# Patient Record
Sex: Female | Born: 1940 | Race: Black or African American | Hispanic: No | Marital: Single
Health system: Southern US, Community
[De-identification: ages and names within clinical notes are randomized; demographics above are authoritative.]

---

## 1998-08-01 ENCOUNTER — Ambulatory Visit (HOSPITAL_COMMUNITY): Admission: RE | Admit: 1998-08-01 | Discharge: 1998-08-01 | Payer: Self-pay | Admitting: Family Medicine

## 1999-05-31 ENCOUNTER — Encounter: Payer: Self-pay | Admitting: Family Medicine

## 1999-05-31 ENCOUNTER — Ambulatory Visit (HOSPITAL_COMMUNITY): Admission: RE | Admit: 1999-05-31 | Discharge: 1999-05-31 | Payer: Self-pay | Admitting: Family Medicine

## 1999-12-17 ENCOUNTER — Other Ambulatory Visit: Admission: RE | Admit: 1999-12-17 | Discharge: 1999-12-17 | Payer: Self-pay | Admitting: Family Medicine

## 2001-12-08 ENCOUNTER — Other Ambulatory Visit: Admission: RE | Admit: 2001-12-08 | Discharge: 2001-12-08 | Payer: Self-pay | Admitting: Family Medicine

## 2001-12-14 ENCOUNTER — Ambulatory Visit (HOSPITAL_COMMUNITY): Admission: RE | Admit: 2001-12-14 | Discharge: 2001-12-14 | Payer: Self-pay | Admitting: Family Medicine

## 2001-12-14 ENCOUNTER — Encounter: Payer: Self-pay | Admitting: Family Medicine

## 2005-07-31 ENCOUNTER — Ambulatory Visit (HOSPITAL_COMMUNITY): Admission: RE | Admit: 2005-07-31 | Discharge: 2005-07-31 | Payer: Self-pay | Admitting: Family Medicine

## 2007-09-06 ENCOUNTER — Encounter: Admission: RE | Admit: 2007-09-06 | Discharge: 2007-09-06 | Payer: Self-pay | Admitting: Family Medicine

## 2008-07-26 ENCOUNTER — Ambulatory Visit (HOSPITAL_COMMUNITY): Admission: RE | Admit: 2008-07-26 | Discharge: 2008-07-26 | Payer: Self-pay | Admitting: Family Medicine

## 2010-07-22 ENCOUNTER — Other Ambulatory Visit (HOSPITAL_COMMUNITY): Payer: Self-pay | Admitting: Family Medicine

## 2010-07-22 DIAGNOSIS — Z1231 Encounter for screening mammogram for malignant neoplasm of breast: Secondary | ICD-10-CM

## 2010-07-25 ENCOUNTER — Ambulatory Visit (HOSPITAL_COMMUNITY)
Admission: RE | Admit: 2010-07-25 | Discharge: 2010-07-25 | Disposition: A | Discharge status: Home / Self Care | Payer: BC Managed Care – PPO | Source: Ambulatory Visit | Attending: Family Medicine | Admitting: Family Medicine

## 2010-07-25 DIAGNOSIS — Z1231 Encounter for screening mammogram for malignant neoplasm of breast: Secondary | ICD-10-CM | POA: Insufficient documentation

## 2011-03-31 ENCOUNTER — Ambulatory Visit
Admission: RE | Admit: 2011-03-31 | Discharge: 2011-03-31 | Disposition: A | Discharge status: Home / Self Care | Payer: BC Managed Care – PPO | Source: Ambulatory Visit | Attending: Family Medicine | Admitting: Family Medicine

## 2011-03-31 ENCOUNTER — Other Ambulatory Visit: Payer: Self-pay | Admitting: Family Medicine

## 2011-03-31 DIAGNOSIS — T148XXA Other injury of unspecified body region, initial encounter: Secondary | ICD-10-CM

## 2011-04-01 ENCOUNTER — Other Ambulatory Visit: Payer: Self-pay | Admitting: Family Medicine

## 2011-04-01 DIAGNOSIS — R52 Pain, unspecified: Secondary | ICD-10-CM

## 2012-01-12 ENCOUNTER — Ambulatory Visit
Admission: RE | Admit: 2012-01-12 | Discharge: 2012-01-12 | Disposition: A | Discharge status: Home / Self Care | Payer: BC Managed Care – PPO | Source: Ambulatory Visit | Attending: Family Medicine | Admitting: Family Medicine

## 2012-01-12 ENCOUNTER — Other Ambulatory Visit: Payer: Self-pay | Admitting: Family Medicine

## 2012-01-12 DIAGNOSIS — M791 Myalgia, unspecified site: Secondary | ICD-10-CM

## 2012-01-12 DIAGNOSIS — R609 Edema, unspecified: Secondary | ICD-10-CM

## 2012-10-14 ENCOUNTER — Other Ambulatory Visit (HOSPITAL_COMMUNITY): Payer: Self-pay | Admitting: Family Medicine

## 2012-10-14 DIAGNOSIS — Z1231 Encounter for screening mammogram for malignant neoplasm of breast: Secondary | ICD-10-CM

## 2012-10-20 ENCOUNTER — Ambulatory Visit (HOSPITAL_COMMUNITY)
Admission: RE | Admit: 2012-10-20 | Discharge: 2012-10-20 | Disposition: A | Discharge status: Home / Self Care | Payer: BC Managed Care – PPO | Source: Ambulatory Visit | Attending: Family Medicine | Admitting: Family Medicine

## 2012-10-20 DIAGNOSIS — Z1231 Encounter for screening mammogram for malignant neoplasm of breast: Secondary | ICD-10-CM

## 2013-10-19 ENCOUNTER — Other Ambulatory Visit (HOSPITAL_COMMUNITY): Payer: Self-pay | Admitting: Family Medicine

## 2013-10-19 DIAGNOSIS — Z1231 Encounter for screening mammogram for malignant neoplasm of breast: Secondary | ICD-10-CM

## 2013-11-02 ENCOUNTER — Ambulatory Visit (HOSPITAL_COMMUNITY)
Admission: RE | Admit: 2013-11-02 | Discharge: 2013-11-02 | Disposition: A | Discharge status: Home / Self Care | Payer: BC Managed Care – PPO | Source: Ambulatory Visit | Attending: Family Medicine | Admitting: Family Medicine

## 2013-11-02 DIAGNOSIS — Z803 Family history of malignant neoplasm of breast: Secondary | ICD-10-CM | POA: Insufficient documentation

## 2013-11-02 DIAGNOSIS — Z1231 Encounter for screening mammogram for malignant neoplasm of breast: Secondary | ICD-10-CM | POA: Insufficient documentation

## 2015-10-05 DIAGNOSIS — N201 Calculus of ureter: Secondary | ICD-10-CM | POA: Diagnosis not present

## 2015-10-17 DIAGNOSIS — K5730 Diverticulosis of large intestine without perforation or abscess without bleeding: Secondary | ICD-10-CM | POA: Diagnosis not present

## 2015-10-17 DIAGNOSIS — Z86010 Personal history of colonic polyps: Secondary | ICD-10-CM | POA: Diagnosis not present

## 2015-10-17 DIAGNOSIS — Z1211 Encounter for screening for malignant neoplasm of colon: Secondary | ICD-10-CM | POA: Diagnosis not present

## 2015-10-17 DIAGNOSIS — R933 Abnormal findings on diagnostic imaging of other parts of digestive tract: Secondary | ICD-10-CM | POA: Diagnosis not present

## 2015-11-08 DIAGNOSIS — N200 Calculus of kidney: Secondary | ICD-10-CM | POA: Diagnosis not present

## 2015-12-26 DIAGNOSIS — I10 Essential (primary) hypertension: Secondary | ICD-10-CM | POA: Diagnosis not present

## 2015-12-26 DIAGNOSIS — R7309 Other abnormal glucose: Secondary | ICD-10-CM | POA: Diagnosis not present

## 2015-12-26 DIAGNOSIS — E782 Mixed hyperlipidemia: Secondary | ICD-10-CM | POA: Diagnosis not present

## 2015-12-26 DIAGNOSIS — Z6831 Body mass index (BMI) 31.0-31.9, adult: Secondary | ICD-10-CM | POA: Diagnosis not present

## 2015-12-26 DIAGNOSIS — E038 Other specified hypothyroidism: Secondary | ICD-10-CM | POA: Diagnosis not present

## 2016-02-14 DIAGNOSIS — L814 Other melanin hyperpigmentation: Secondary | ICD-10-CM | POA: Diagnosis not present

## 2016-03-13 DIAGNOSIS — Z23 Encounter for immunization: Secondary | ICD-10-CM | POA: Diagnosis not present

## 2016-04-25 ENCOUNTER — Other Ambulatory Visit: Payer: Self-pay | Admitting: Family Medicine

## 2016-04-25 DIAGNOSIS — E038 Other specified hypothyroidism: Secondary | ICD-10-CM | POA: Diagnosis not present

## 2016-04-25 DIAGNOSIS — E2839 Other primary ovarian failure: Secondary | ICD-10-CM

## 2016-04-25 DIAGNOSIS — Z6831 Body mass index (BMI) 31.0-31.9, adult: Secondary | ICD-10-CM | POA: Diagnosis not present

## 2016-04-25 DIAGNOSIS — E782 Mixed hyperlipidemia: Secondary | ICD-10-CM | POA: Diagnosis not present

## 2016-04-25 DIAGNOSIS — I10 Essential (primary) hypertension: Secondary | ICD-10-CM | POA: Diagnosis not present

## 2017-04-07 DIAGNOSIS — Z23 Encounter for immunization: Secondary | ICD-10-CM | POA: Diagnosis not present

## 2017-12-08 ENCOUNTER — Ambulatory Visit: Payer: Medicare Other | Attending: Family Medicine | Admitting: Physical Therapy

## 2017-12-08 ENCOUNTER — Encounter: Payer: Self-pay | Admitting: Physical Therapy

## 2017-12-08 ENCOUNTER — Other Ambulatory Visit: Payer: Self-pay

## 2017-12-08 DIAGNOSIS — M6281 Muscle weakness (generalized): Secondary | ICD-10-CM | POA: Diagnosis present

## 2017-12-08 DIAGNOSIS — R29898 Other symptoms and signs involving the musculoskeletal system: Secondary | ICD-10-CM | POA: Insufficient documentation

## 2017-12-08 DIAGNOSIS — M25551 Pain in right hip: Secondary | ICD-10-CM | POA: Insufficient documentation

## 2017-12-08 NOTE — Therapy (Signed)
[facility] [address]  [address] [place], [place], [address] Phone: [phone]   Fax:  [phone]  Physical Therapy Evaluation  Patient Details  Name: Kendra MoatsClaudia Ellis MRN: [id number] Date of Birth: Sep 21, 1940 Referring Provider: Eula Listenominic McKinley, MD   Encounter Date: 12/08/2017  PT End of Session - 12/08/17 1537    Visit Number  1    Number of Visits  5    Date for PT Re-Evaluation  01/05/18    Authorization Type  UHC Medicare    PT Start Time  1450    PT Stop Time  1532    PT Time Calculation (min)  42 min    Activity Tolerance  Patient tolerated treatment well    Behavior During Therapy  [facility] assessed/performed       History reviewed. No pertinent past medical history.  History reviewed. No pertinent surgical history.  There were no vitals filed for this visit.   Subjective Assessment - 12/08/17 1452    Subjective  Patient reports R hip pain started about 2 months ago. Reports no events that could have caused this. Pain starts at lateral hip and radiates down the side of the R leg. Patient is now careful about walking or turning quickly/twisting her hip. Reports it seems to get better the more she exercises. Reports she cannot recall any aggravating factors, but is concerned about the length of time that this has been bothering her. Pain at best: 0/10, at worst 8-9/10. Recently has been getting better- has not been taking Aleve for the past 2 weeks and has been moving more. Denies N/T,    Limitations  Sitting    How long can you sit comfortably?  couple of hours    How long can you stand comfortably?  unlimited    How long can you walk comfortably?  unlimited     Diagnostic tests  per pt- had xray but unsure of results    Patient Stated Goals  get better at movement and strengthening     Currently in Pain?  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   No    Is the patient reluctant to leave their home because of a fear of falling?   No      Home Environment   Living Environment  Private residence    Living Arrangements  Alone    Available Help at Discharge  Family;Friend(s);Neighbor    Type of Home  House  Home Access  Stairs to enter    Entrance Stairs-Number of Steps  2    Entrance Stairs-Rails  None    Home Layout  Two level    Alternate Level Stairs-Number of Steps  13    Alternate Level Stairs-Rails  Left    Home Equipment  None      Prior Function   Level of Independence  Independent    Vocation  Retired    Leisure  none      Cognition   Overall Cognitive Status  Within Functional Limits for tasks assessed      Observation/Other Assessments   Focus on Therapeutic Outcomes (FOTO)   Hip: 64 (36% limited, 32% predicted)      Sensation   Light Touch  Appears Intact      Coordination   Gross Motor Movements are Fluid and Coordinated  Yes      Posture/Postural Control   Posture/Postural Control  Postural limitations    Postural Limitations  Rounded Shoulders      ROM / Strength   AROM / PROM / Strength  AROM;Strength      AROM   AROM Assessment Site  Hip    Right/Left Hip  Right;Left    Right Hip Flexion  100    Right Hip External Rotation   19    Right Hip Internal Rotation    30    Right Hip ABduction  24 slight pain    Left Hip Flexion  105    Left Hip External Rotation   25    Left Hip Internal Rotation   10      Strength   Strength Assessment Site  Hip;Knee;Ankle    Right/Left Hip  Right;Left    Right Hip Flexion  4/5    Right Hip ABduction  4+/5    Right Hip ADduction  4+/5    Left Hip Flexion  4/5    Left Hip ABduction  4+/5    Left Hip ADduction  4+/5    Right/Left Knee  Right;Left    Right Knee Flexion  4/5 mild discomfort    Right Knee Extension  4+/5    Left Knee Flexion  4+/5    Left Knee Extension  4+/5    Right/Left Ankle  Right;Left    Right Ankle Dorsiflexion  4+/5    Right Ankle Plantar Flexion  4+/5    Left Ankle Dorsiflexion  4+/5    Left Ankle Plantar Flexion  4+/5      Flexibility   Soft Tissue Assessment /Muscle Length  yes    Hamstrings  B WNL    Quadriceps  B severe tightness; soft tissue restriction    ITB  B moderate tightness; soft tissue restriction      Palpation   Palpation comment  slight tenderness in R HS; soft tissue restriction in B lateral hip      Ambulation/Gait   Ambulation/Gait  Yes    Ambulation/Gait Assistance  7: Independent    Assistive device  None    Gait Pattern  Within Functional Limits                Objective measurements completed on examination: See above findings.              PT Education - 12/08/17 1533    Education Details  prognosis, POC, HEP    Person(s) Educated  Patient    Methods  Explanation;Demonstration;Tactile cues;Handout;Verbal cues    Comprehension  Returned  demonstration;Verbalized understanding       PT Short Term Goals - 12/08/17 1542      PT SHORT TERM GOAL #1   Title  Patient to be independent with initial HEP.    Time  2    Period  Weeks    Status  New    Target Date  12/22/17        PT Long Term Goals - 12/08/17 1543      PT LONG TERM GOAL #1   Title  Patient to be independent with advanced HEP.    Time  4    Period  Weeks     Status  New    Target Date  01/05/18      PT LONG TERM GOAL #2   Title  Patient to demonstrate improvement in R hip AROM without pain limiting.     Time  4    Period  Weeks    Status  New    Target Date  01/05/18      PT LONG TERM GOAL #3   Title  Patient to demonstrate B LE strength >=4+/5.    Time  4    Period  Days    Status  New    Target Date  01/05/18      PT LONG TERM GOAL #4   Title  Patient to demonstrate 75% improvement in flexibility in B quads and TFL.    Time  4    Period  Weeks    Status  New    Target Date  01/05/18             Plan - 12/08/17 1537    Clinical Impression Statement  Patient is a 76y/o F presenting to OPPT with c/o R hip pain of 2 months duration. Unable to report aggravating factors, but eases with Aleve and exercise. Reports pain starts at lateral hip and radiates down the side of the R leg. Patient today with decreased B hip AROM, decreased hip and knee flexion strength, and soft tissue restriction and decreased flexibility in B quads and TFL. Patient educated on gentle stretching and strengthening HEP and reported understanding. Would benefit from skilled PT services 1x/week for 4 weeks to address aforementioned impairments.     Clinical Presentation  Stable    Clinical Decision Making  Low    Rehab Potential  Good    Clinical Impairments Affecting Rehab Potential  Patient is a 76y/o F presenting to OPPT with c/o R hip pain of 2 months duration. Unable to report aggravating factors, but eases with Aleve and exercise. Reports pain starts at lateral hip and radiates down the side of the R leg. Patient today with decreased B hip AROM, decreased hip and knee flexion strength, and soft tissue restriction and decreased flexibility in B quads and TFL. Patient educated on gentle stretching and strengthening HEP and reported understanding. Would benefit from skilled PT services 1x/week for 4 weeks to address aforementioned impairments.     PT Frequency   1x / week    PT Duration  4 weeks    PT Treatment/Interventions  ADLs/Self Care Home Management;Cryotherapy;Electrical Stimulation;Iontophoresis 4mg /ml Dexamethasone;Moist Heat;Traction;Ultrasound;Gait training;Stair training;Therapeutic activities;Therapeutic exercise;Manual techniques;Patient/family education;Neuromuscular re-education;Balance training;Passive range of motion;Dry needling;Energy conservation;Splinting;Taping;Vasopneumatic Device    PT Next Visit Plan  reassess and update HEP    Consulted and Agree with Plan of Care  Patient       Patient will benefit from skilled therapeutic intervention in order to improve the  following deficits and impairments:  Decreased strength, Pain, Impaired flexibility, Postural dysfunction, Decreased range of motion  Visit Diagnosis: Pain in right hip  Other symptoms and signs involving the musculoskeletal system  Muscle weakness (generalized)     Problem List There are no active problems to display for this patient.    Anette Guarneri, PT, DPT 12/08/17 3:46 PM   [facility] [address]  [address] [place], [place], [address] Phone: [phone]   Fax:  [phone]  Name: Kendra Ellis MRN: [id number] Date of Birth: 1940-12-24

## 2017-12-15 ENCOUNTER — Ambulatory Visit: Payer: Medicare Other | Admitting: Physical Therapy

## 2017-12-15 DIAGNOSIS — M6281 Muscle weakness (generalized): Secondary | ICD-10-CM

## 2017-12-15 DIAGNOSIS — M25551 Pain in right hip: Secondary | ICD-10-CM

## 2017-12-15 DIAGNOSIS — R29898 Other symptoms and signs involving the musculoskeletal system: Secondary | ICD-10-CM

## 2017-12-15 NOTE — Therapy (Signed)
[facility] [address]  [address] [place], [place], [address] Phone: [phone]   Fax:  [phone]  Physical Therapy Treatment  Patient Details  Name: Kendra MoatsClaudia Ellis MRN: [id number] Date of Birth: 1941-05-03 Referring Provider: Eula Listenominic McKinley, MD   Encounter Date: 12/15/2017  PT End of Session - 12/15/17 1402    Visit Number  2    Number of Visits  5    Date for PT Re-Evaluation  01/05/18    Authorization Type  UHC Medicare    PT Start Time  1317    PT Stop Time  1359    PT Time Calculation (min)  42 min    Activity Tolerance  Patient tolerated treatment well    Behavior During Therapy  [facility] for tasks assessed/performed       No past medical history on file.  No past surgical history on file.  There were no vitals filed for this visit.  Subjective Assessment - 12/15/17 1320    Subjective  Reports she has been well. Reports she has not been performing HEP at home. Wants to get a mat and a strap to do her exercises with.    Diagnostic tests  per pt- had xray but unsure of results    Patient Stated Goals  get better at movement and strengthening     Currently in Pain?  Yes    Pain Score  1     Pain Location  Hip    Pain Orientation  Right    Pain Descriptors / Indicators  Aching    Pain Type  Acute pain                       [facility] Adult PT Treatment/Exercise - 12/15/17 0001      Exercises   Exercises  Knee/Hip      Knee/Hip Exercises: Stretches   Quad Stretch  Right;Left;1 rep;30 seconds;Limitations    LobbyistQuad Stretch Limitations  prone strap    ITB Stretch  Right;Left;30 seconds;2 reps;Limitations    ITB Stretch Limitations  supine strap    Gastroc Stretch  Right;Left;1 rep;20 seconds;Limitations    Gastroc Stretch Limitations  DF against the wall with CGA for balance      Knee/Hip Exercises: Aerobic   Nustep  L3 x 6 min UE/LE      Knee/Hip Exercises: Standing   Wall Squat  2  sets;Limitations    Wall Squat Limitations  2x10; manual resistance at knees to promote ER      Knee/Hip Exercises: Supine   Knee Flexion  Strengthening;Both;1 set;20 reps;Limitations    Knee Flexion Limitations  resisted hip flexion with red TB and B LEs on pball heavy TCs for form      Knee/Hip Exercises: Sidelying   Hip ABduction  Strengthening;Right;Left;1 set;10 reps;Limitations    Hip ABduction Limitations  VC/TCs for LE placement    Hip ADduction  Strengthening;Right;Left;1 set;10 reps;Limitations    Hip ADduction Limitations  VC/TCs for LE placement    Clams  10x each side VC/TCs to avoid trunk rotation      Knee/Hip Exercises: Prone   Other Prone Exercises  prone donkey kicks; 10x poor carryover of instructions    Other Prone Exercises  quadruped fire hydrants x10 on each side VC/TCs to avoid trunk rotation; difficulty performing on R       Manual Therapy   Manual Therapy  Soft tissue mobilization    Soft tissue mobilization  R piriformis, upper glute, TFL and ITB- mild TTP in piriformis             PT Education - 12/15/17 1402    Education Details  addition to HEP    Person(s) Educated  Patient    Methods  Explanation;Demonstration;Tactile cues;Verbal cues;Handout    Comprehension  Returned demonstration;Verbalized understanding       PT Short Term Goals - 12/08/17 1542      PT SHORT TERM GOAL #1   Title  Patient to be independent with initial HEP.    Time  2    Period  Weeks    Status  New    Target Date  12/22/17        PT Long Term Goals - 12/08/17 1543      PT LONG TERM GOAL #1   Title  Patient to be independent with advanced HEP.    Time  4    Period  Weeks    Status  New    Target Date  01/05/18      PT LONG TERM GOAL #2   Title  Patient to demonstrate improvement in R hip AROM without pain limiting.     Time  4    Period  Weeks    Status  New    Target Date  01/05/18      PT LONG TERM GOAL #3   Title  Patient to demonstrate B LE  strength >=4+/5.    Time  4    Period  Days    Status  New    Target Date  01/05/18      PT LONG TERM GOAL #4   Title  Patient to demonstrate 75% improvement in flexibility in B quads and TFL.    Time  4    Period  Weeks    Status  New    Target Date  01/05/18            Plan - 12/15/17 1403    Clinical Impression Statement  Patient arrived to session with no new complaints, however reports she has not been compliant with HEP. Educated patient on importance of maintaining consistency with HEP to improve symptoms. Patient reported understanding.  Tolerated STM to R piriformis, TFL, ITB, and glute- patient slightly TTP in piriformis. Reviewed HEP to promote compliance. Patient with report of mild "pulling" in R lateral hip during hip strengthening ther-ex but denied pain. Also with intermittent calf cramp on B LEs which was relieved with gastroc stretch. Added gastroc stretch to HEP and advised patient to perform this with solid surface like wall or counter top. Patient reported understanding. Performed wall mini squats with manual resistance at lateral knees to promote ER. Patient with mild c/o knee pain but no hip pain.     PT Treatment/Interventions  ADLs/Self Care Home Management;Cryotherapy;Electrical Stimulation;Iontophoresis 4mg /ml Dexamethasone;Moist Heat;Traction;Ultrasound;Gait training;Stair training;Therapeutic activities;Therapeutic exercise;Manual techniques;Patient/family education;Neuromuscular re-education;Balance training;Passive range of motion;Dry needling;Energy conservation;Splinting;Taping;Vasopneumatic Device    PT Next Visit Plan  assess HEP compliance    Consulted and Agree with Plan of Care  Patient       Patient will benefit from skilled therapeutic intervention in order to improve the following deficits and impairments:  Decreased strength, Pain, Impaired flexibility, Postural dysfunction, Decreased range of motion  Visit Diagnosis: Pain in right hip  Other  symptoms and signs involving the musculoskeletal system  Muscle weakness (generalized)     Problem List There are no active problems to display for this  patient.  Anette Guarneri, PT, DPT 12/15/17 2:08 PM   [facility] [address]  [address] [place], [place], [address] Phone: [phone]   Fax:  [phone]  Name: Kendra Ellis MRN: [id number] Date of Birth: 11-03-1940

## 2017-12-22 ENCOUNTER — Encounter: Payer: Self-pay | Admitting: Physical Therapy

## 2017-12-22 ENCOUNTER — Ambulatory Visit: Payer: Medicare Other | Admitting: Physical Therapy

## 2017-12-22 DIAGNOSIS — M6281 Muscle weakness (generalized): Secondary | ICD-10-CM

## 2017-12-22 DIAGNOSIS — M25551 Pain in right hip: Secondary | ICD-10-CM | POA: Diagnosis not present

## 2017-12-22 DIAGNOSIS — R29898 Other symptoms and signs involving the musculoskeletal system: Secondary | ICD-10-CM

## 2017-12-22 NOTE — Therapy (Signed)
[facility] [address]  [address] [place], [place], [address] Phone: [phone]   Fax:  [phone]  Physical Therapy Treatment  Patient Details  Name: Kendra Ellis MRN: [id number] Date of Birth: 08/17/1940 Referring Provider: Eula Listen, MD   Encounter Date: 12/22/2017  PT End of Session - 12/22/17 1350    Visit Number  3    Number of Visits  5    Date for PT Re-Evaluation  01/05/18    Authorization Type  UHC Medicare    PT Start Time  1304    PT Stop Time  1349    PT Time Calculation (min)  45 min    Activity Tolerance  Patient tolerated treatment well    Behavior During Therapy  [facility] for tasks assessed/performed       History reviewed. No pertinent past medical history.  History reviewed. No pertinent surgical history.  There were no vitals filed for this visit.  Subjective Assessment - 12/22/17 1306    Subjective  Reports she did a lot of walking over the weekend. Did some stretches but not much. Using a rubber band for her stretches.    Diagnostic tests  per pt- had xray but unsure of results    Patient Stated Goals  get better at movement and strengthening     Currently in Pain?  No/denies                       [facility] Adult PT Treatment/Exercise - 12/22/17 0001      Knee/Hip Exercises: Stretches   Lobbyist  Right;Left;1 rep;30 seconds;Limitations    Lobbyist Limitations  prone strap    ITB Stretch  Right;2 reps;30 seconds;Limitations    ITB Stretch Limitations  supine strap      Knee/Hip Exercises: Aerobic   Nustep  L3 x 6 min      Knee/Hip Exercises: Machines for Strengthening   Cybex Knee Flexion  15x 20# B LEs VCs to decrease speed      Knee/Hip Exercises: Standing   Forward Step Up  Right;1 set;Hand Hold: 0;Step Height: 6";Limitations    Forward Step Up Limitations  R step up/down with CGA; Vcs to hit with heel, slow speed    SLS  L LE SLS with R ER isometric ball  against wall; 3x10" heavy VC/TCs for form and upright posture      Knee/Hip Exercises: Seated   Other Seated Knee/Hip Exercises  B LE hip IR/ER 15x each; red IR, green ER cues for form and squeezing ankles together with ER      Knee/Hip Exercises: Supine   Bridges  Strengthening;Both;1 set;15 reps;Limitations    Bridges Limitations  B LEs on pball with straight LEs    Knee Flexion Limitations  resisted hip flexion with red TB and B LEs on pball; 10x each LE      Knee/Hip Exercises: Sidelying   Hip ADduction  Strengthening;Right;Left;1 set;Limitations;15 reps    Hip ADduction Limitations  VC/TCs for LE placement    Clams  15x each side red TB around knees      Knee/Hip Exercises: Prone   Other Prone Exercises  prone donkey kicks; 10x each LE      Manual Therapy   Manual Therapy  Soft tissue mobilization    Soft tissue mobilization  R piriformis, upper glute, TFL and ITB- mildly TTP in lateral hip  PT Short Term Goals - 12/08/17 1542      PT SHORT TERM GOAL #1   Title  Patient to be independent with initial HEP.    Time  2    Period  Weeks    Status  New    Target Date  12/22/17        PT Long Term Goals - 12/08/17 1543      PT LONG TERM GOAL #1   Title  Patient to be independent with advanced HEP.    Time  4    Period  Weeks    Status  New    Target Date  01/05/18      PT LONG TERM GOAL #2   Title  Patient to demonstrate improvement in R hip AROM without pain limiting.     Time  4    Period  Weeks    Status  New    Target Date  01/05/18      PT LONG TERM GOAL #3   Title  Patient to demonstrate B LE strength >=4+/5.    Time  4    Period  Days    Status  New    Target Date  01/05/18      PT LONG TERM GOAL #4   Title  Patient to demonstrate 75% improvement in flexibility in B quads and TFL.    Time  4    Period  Weeks    Status  New    Target Date  01/05/18            Plan - 12/22/17 1351    Clinical Impression Statement   Patient arrived to session with no new complaints. Reports intermittent compliance with HEP however performing stretches with "rubber band." Advised patient to discontinue using this and instead us sturdy object like belt or sheet/towel. Also emphasized importance of compliance with HEP to ensure progress. Patient reported understanding. Tolerated progression of clamshells this session with addition of resistance band. Still requiring frequent cueing for technique throughout ther-ex. Report of "feeling it" in R hip during sitting hip ER, patient unsure of whether this is pain or muscle fatigue. Worked on R LE anterior step up/downs with CGA- given cues to decrease speed and hit with heel first. Patient slightly off balance with this activity. Attempted L LE SLS with R LE ER isometric. Patient off balance and difficulty maintaining upright trunk. Deferring this activity for later. Ended session with patient reporting "I feel it" in R hip but declined ice and denied pain.     PT Treatment/Interventions  ADLs/Self Care Home Management;Cryotherapy;Electrical Stimulation;Iontophoresis 4mg /ml Dexamethasone;Moist Heat;Traction;Ultrasound;Gait training;Stair training;Therapeutic activities;Therapeutic exercise;Manual techniques;Patient/family education;Neuromuscular re-education;Balance training;Passive range of motion;Dry needling;Energy conservation;Splinting;Taping;Vasopneumatic Device    Consulted and Agree with Plan of Care  Patient       Patient will benefit from skilled therapeutic intervention in order to improve the following deficits and impairments:  Decreased strength, Pain, Impaired flexibility, Postural dysfunction, Decreased range of motion  Visit Diagnosis: Pain in right hip  Other symptoms and signs involving the musculoskeletal system  Muscle weakness (generalized)     Problem List There are no active problems to display for this patient.   Anette GuarneriYevgeniya Teigen Parslow, PT, DPT 12/22/17 1:55  PM   [facility] [place] [address]  [address] [place], [place], [address] Phone: [phone]   Fax:  [phone]  Name: Kendra MoatsClaudia Ellis MRN: [id number] Date of Birth: 06/26/1940

## 2017-12-29 ENCOUNTER — Ambulatory Visit: Payer: Medicare Other | Attending: Family Medicine | Admitting: Physical Therapy

## 2017-12-29 DIAGNOSIS — M25551 Pain in right hip: Secondary | ICD-10-CM | POA: Diagnosis present

## 2017-12-29 DIAGNOSIS — M6281 Muscle weakness (generalized): Secondary | ICD-10-CM | POA: Diagnosis present

## 2017-12-29 DIAGNOSIS — R29898 Other symptoms and signs involving the musculoskeletal system: Secondary | ICD-10-CM | POA: Insufficient documentation

## 2017-12-29 NOTE — Therapy (Signed)
[facility] [place] [address]  [address] [place], [place], [address] Phone: [phone]   Fax:  [phone]  Physical Therapy Treatment  Patient Details  Name: Kendra MoatsClaudia Ellis MRN: [id number] Date of Birth: 1940-10-30 Referring Provider: Eula Listenominic McKinley, MD   Encounter Date: 12/29/2017  PT End of Session - 12/29/17 1403    Visit Number  4    Number of Visits  5    Date for PT Re-Evaluation  01/05/18    Authorization Type  UHC Medicare    PT Start Time  1328    PT Stop Time  1358    PT Time Calculation (min)  30 min    Activity Tolerance  Patient tolerated treatment well    Behavior During Therapy  [facility] for tasks assessed/performed       No past medical history on file.  No past surgical history on file.  There were no vitals filed for this visit.  Subjective Assessment - 12/29/17 1329    Subjective  Reports R hip hasn't been bothering her at all. Reports she has been doing "some of my exercises, not that many." Reports soreness in R lateral calf after last session.    Diagnostic tests  per pt- had xray but unsure of results    Patient Stated Goals  get better at movement and strengthening     Currently in Pain?  Yes    Pain Score  2     Pain Location  Calf    Pain Orientation  Right;Lateral                       [facility] Adult PT Treatment/Exercise - 12/29/17 0001      Ambulation/Gait   Stairs  Yes    Stairs Assistance  4: Min guard    Stair Management Technique  One rail Right;No rails    Number of Stairs  13    Height of Stairs  8    Gait Comments  stair training with CGA- encouraging reciprocal climbing pattern without handrail; patient hesitant throughout      Knee/Hip Exercises: Stretches   Occupational hygienistQuad Stretch  Right;30 seconds;Limitations;2 reps    Quad Stretch Limitations  prone on bolster; strap    ITB Stretch  Right;2 reps;30 seconds;Limitations    ITB Stretch Limitations  supine strap       Knee/Hip Exercises: Aerobic   Nustep  L3 x 6 min      Knee/Hip Exercises: Standing   Forward Step Up  Right;1 set;Hand Hold: 0;Step Height: 6";Limitations;15 reps VCs to avoid circumduction, hit with heel, slow eccentric lo    Forward Step Up Limitations  R step up/down with CGA;    Wall Squat  2 sets;10 reps;Limitations    Wall Squat Limitations  red TB around knees    Other Standing Knee Exercises  sidestepping with red TB around ankles; 2 x 2120ft cues to control movement; pt mildly unstable      Knee/Hip Exercises: Seated   Other Seated Knee/Hip Exercises  B LE hip IR/ER 15x each with red TB               PT Short Term Goals - 12/08/17 1542      PT SHORT TERM GOAL #1   Title  Patient to be independent with initial HEP.    Time  2    Period  Weeks    Status  New    Target Date  12/22/17        PT Long Term Goals - 12/08/17 1543      PT LONG TERM GOAL #1   Title  Patient to be independent with advanced HEP.    Time  4    Period  Weeks    Status  New    Target Date  01/05/18      PT LONG TERM GOAL #2   Title  Patient to demonstrate improvement in R hip AROM without pain limiting.     Time  4    Period  Weeks    Status  New    Target Date  01/05/18      PT LONG TERM GOAL #3   Title  Patient to demonstrate B LE strength >=4+/5.    Time  4    Period  Days    Status  New    Target Date  01/05/18      PT LONG TERM GOAL #4   Title  Patient to demonstrate 75% improvement in flexibility in B quads and TFL.    Time  4    Period  Weeks    Status  New    Target Date  01/05/18            Plan - 12/29/17 1404    Clinical Impression Statement  Patient arrived late to session with report that she has not been having R hip pain lately. Still noncompliant with HEP- discussed with patient importance of maintaining compliance with home program to maintain strength and pain management. Patient reported understanding. Reporting soreness in R lateral calf after last  session- no tenderness to palpation. Working on anterior step ups/step downs with R LE- cues provided to avoid circumduction, hit with heel, and slow eccentric lower. Patient reporting performing step-to pattern with stairs at home- worked on Museum/gallery curator with CGA, encouraging reciprocal climbing pattern without handrail; patient hesitant throughout d/t feeling like R LE is weaker. However, patient fairly stable throughout. No c/o R hip or calf pain at end of session. Patient reporting she will be ready for d/c to home program at next session.     PT Treatment/Interventions  ADLs/Self Care Home Management;Cryotherapy;Electrical Stimulation;Iontophoresis 4mg /ml Dexamethasone;Moist Heat;Traction;Ultrasound;Gait training;Stair training;Therapeutic activities;Therapeutic exercise;Manual techniques;Patient/family education;Neuromuscular re-education;Balance training;Passive range of motion;Dry needling;Energy conservation;Splinting;Taping;Vasopneumatic Device    PT Next Visit Plan  d/c next session    Consulted and Agree with Plan of Care  Patient       Patient will benefit from skilled therapeutic intervention in order to improve the following deficits and impairments:  Decreased strength, Pain, Impaired flexibility, Postural dysfunction, Decreased range of motion  Visit Diagnosis: Pain in right hip  Other symptoms and signs involving the musculoskeletal system  Muscle weakness (generalized)     Problem List There are no active problems to display for this patient.   Anette Guarneri, PT, DPT 12/29/17 2:05 PM   [facility] [address]  [address] [place], [place], [address] Phone: [phone]   Fax:  [phone]  Name: Kendra Ellis MRN: [id number] Date of Birth: 05-Mar-1941

## 2018-01-05 ENCOUNTER — Ambulatory Visit: Payer: Medicare Other | Admitting: Physical Therapy

## 2018-01-05 DIAGNOSIS — M25551 Pain in right hip: Secondary | ICD-10-CM | POA: Diagnosis not present

## 2018-01-05 DIAGNOSIS — R29898 Other symptoms and signs involving the musculoskeletal system: Secondary | ICD-10-CM

## 2018-01-05 DIAGNOSIS — M6281 Muscle weakness (generalized): Secondary | ICD-10-CM

## 2018-01-05 NOTE — Therapy (Signed)
[facility] [address]  [place], [place], [address] Phone: [phone]   Fax:  [phone]  Physical Therapy Discharge  Patient Details  Name: Kendra Ellis MRN: [id number] Date of Birth: Sep 27, 1940 Referring Provider: Rhina Brackett, MD    Progress Note Reporting Period 12/08/17 to 01/05/18  See note below for Objective Data and Assessment of Progress/Goals.   Encounter Date: 01/05/2018  PT End of Session - 01/05/18 1549    Visit Number  5    Number of Visits  5    Date for PT Re-Evaluation  01/05/18    Authorization Type  UHC Medicare    PT Start Time  1327   patient arrived late   PT Stop Time  1400    PT Time Calculation (min)  33 min    Activity Tolerance  Patient tolerated treatment well    Behavior During Therapy  WFL for tasks assessed/performed       No past medical history on file.  No past surgical history on file.  There were no vitals filed for this visit.  Subjective Assessment - 01/05/18 1328    Subjective  Reporting "my hip hasn't bothered me at all." Reports 80% improvement since initial eval. Reports still having intermittent pain. Reports compliance with HEP.    Diagnostic tests  per pt- had xray but unsure of results    Patient Stated Goals  get better at movement and strengthening     Currently in Pain?  No/denies         [facility] PT Assessment - 01/05/18 0001      Observation/Other Assessments   Focus on Therapeutic Outcomes (FOTO)   Hip: 77 (23% limited, 32% predicted)      AROM   AROM Assessment Site  Hip    Right/Left Hip  Right;Left    Right Hip Flexion  112    Right Hip External Rotation   32    Right Hip Internal Rotation   30    Right Hip ABduction  24      Strength   Strength Assessment Site  Hip;Knee;Ankle    Right/Left Hip  Right;Left    Right Hip Flexion  4/5    Right Hip ABduction  4+/5    Right Hip ADduction  4+/5    Left Hip Flexion  4/5    Left  Hip ABduction  4+/5    Left Hip ADduction  4+/5    Right/Left Knee  Right;Left    Right Knee Flexion  4/5    Right Knee Extension  4+/5    Left Knee Flexion  4+/5    Left Knee Extension  4+/5    Right/Left Ankle  Right;Left    Right Ankle Dorsiflexion  4+/5    Right Ankle Plantar Flexion  4+/5    Left Ankle Dorsiflexion  4+/5    Left Ankle Plantar Flexion  4+/5      Flexibility   Quadriceps  R moderate tightness    ITB  R moderate tightness                   [facility] Adult PT Treatment/Exercise - 01/05/18 0001      Knee/Hip Exercises: Stretches   Sports administrator  Right;30 seconds;Limitations;2 reps    Sports administrator Limitations  prone strap      Knee/Hip Exercises: Aerobic   Nustep  L3 x 6 min      Knee/Hip Exercises: Standing  Other Standing Knee Exercises  R TKE 15x with blue TB at chair      Knee/Hip Exercises: Seated   Other Seated Knee/Hip Exercises  B LE hip IR 15x each with red TB      Knee/Hip Exercises: Sidelying   Clams  10x each side with 3 beat pulse and red TB around knees      Knee/Hip Exercises: Prone   Other Prone Exercises  prone donkey kicks + ABD; 10x each LE   VC/TCs to avoid trunk rotation     Manual Therapy   Manual Therapy  Passive ROM    Passive ROM  R ober stretch with PT OP; 2x30"             PT Education - 01/05/18 1333    Education Details  consolidated HEP    Person(s) Educated  Patient    Methods  Explanation;Tactile cues;Demonstration;Handout;Verbal cues    Comprehension  Verbalized understanding;Returned demonstration       PT Short Term Goals - 01/05/18 1333      PT SHORT TERM GOAL #1   Title  Patient to be independent with initial HEP.    Time  2    Period  Weeks    Status  Achieved        PT Long Term Goals - 01/05/18 1333      PT LONG TERM GOAL #1   Title  Patient to be independent with advanced HEP.    Time  4    Period  Weeks    Status  Not Met   advanced HEP not administered d/t noncompliance  with initial HEP     PT LONG TERM GOAL #2   Title  Patient to demonstrate improvement in R hip AROM without pain limiting.     Time  4    Period  Weeks    Status  Partially Met   improvements demonstrated in R hip flexion and ER, no pain with any planes of motion     PT LONG TERM GOAL #3   Title  Patient to demonstrate B LE strength >=4+/5.    Time  4    Period  Days    Status  Partially Met   MMT unchanged from initial eval     PT LONG TERM GOAL #4   Title  Patient to demonstrate 75% improvement in flexibility in B quads and TFL.    Time  4    Period  Weeks    Status  Partially Met   25% improvement demonstrated in R quad flexibility, TFL unchanged           Plan - 01/05/18 1557    Clinical Impression Statement  Patient arrived to session late. Reports 80% improvement since initial visit. Still noting occasional R hip pain, but no pain that she can recently recall. Updated goals- improvements demonstrated in R hip flexion and ER, no pain with any planes of motion. Strength testing remained unchanged. R quad flexibility improved by 25% however TFL flexibility unchanged. Goals have either not been met or partially met at this time d/t patient noncompliance with HEP. Patient reporting compliance with HEP for the first time this session. Spoke to patient about importance of maintaining consistent exercises regimen. Patient reported understanding. Focused session on progressive hip strengthening with heavy VC/TCs required to correct form. Administered and reviewed consolidated HEP program- patient reported understanding. Patient to be d/c'd at this time d/t relief of symptoms. Patient agreeable.  PT Treatment/Interventions  ADLs/Self Care Home Management;Cryotherapy;Electrical Stimulation;Iontophoresis 55m/ml Dexamethasone;Moist Heat;Traction;Ultrasound;Gait training;Stair training;Therapeutic activities;Therapeutic exercise;Manual techniques;Patient/family education;Neuromuscular  re-education;Balance training;Passive range of motion;Dry needling;Energy conservation;Splinting;Taping;Vasopneumatic Device    PT Next Visit Plan  d/c at this time    Consulted and Agree with Plan of Care  Patient       Patient will benefit from skilled therapeutic intervention in order to improve the following deficits and impairments:  Decreased strength, Pain, Impaired flexibility, Postural dysfunction, Decreased range of motion  Visit Diagnosis: Pain in right hip  Other symptoms and signs involving the musculoskeletal system  Muscle weakness (generalized)     Problem List There are no active problems to display for this patient.  PHYSICAL THERAPY DISCHARGE SUMMARY  Visits from Start of Care: 5  Current functional level related to goals / functional outcomes: See above clinical impression   Remaining deficits: Decreased B LE strength, decreased ROM, decreased flexibility    Education / Equipment: See above  Plan: Patient agrees to discharge.  Patient goals were partially met. Patient is being discharged due to being pleased with the current functional level.  ?????     YJanene Harvey PT, DPT 01/05/18 3:59 PM  [place] [address] [place] [address]Phone: [phone]  Fax:  [phone] Name: Kendra PinaMRN: [id number] of Birth: [birth year]

## 2018-06-10 ENCOUNTER — Other Ambulatory Visit (HOSPITAL_BASED_OUTPATIENT_CLINIC_OR_DEPARTMENT_OTHER): Payer: Self-pay | Admitting: Family Medicine

## 2018-06-10 DIAGNOSIS — R9389 Abnormal findings on diagnostic imaging of other specified body structures: Secondary | ICD-10-CM

## 2018-06-16 ENCOUNTER — Ambulatory Visit (HOSPITAL_BASED_OUTPATIENT_CLINIC_OR_DEPARTMENT_OTHER)
Admission: RE | Admit: 2018-06-16 | Discharge: 2018-06-16 | Disposition: A | Discharge status: Home / Self Care | Payer: Medicare Other | Source: Ambulatory Visit | Attending: Family Medicine | Admitting: Family Medicine

## 2018-06-16 DIAGNOSIS — R9389 Abnormal findings on diagnostic imaging of other specified body structures: Secondary | ICD-10-CM | POA: Insufficient documentation

## 2019-06-17 DIAGNOSIS — I10 Essential (primary) hypertension: Secondary | ICD-10-CM | POA: Diagnosis not present

## 2019-06-17 DIAGNOSIS — G510 Bell's palsy: Secondary | ICD-10-CM | POA: Diagnosis not present

## 2019-06-17 DIAGNOSIS — E785 Hyperlipidemia, unspecified: Secondary | ICD-10-CM | POA: Diagnosis not present

## 2019-06-17 DIAGNOSIS — R739 Hyperglycemia, unspecified: Secondary | ICD-10-CM | POA: Diagnosis not present

## 2019-06-17 DIAGNOSIS — E038 Other specified hypothyroidism: Secondary | ICD-10-CM | POA: Diagnosis not present

## 2019-06-29 DIAGNOSIS — Z0000 Encounter for general adult medical examination without abnormal findings: Secondary | ICD-10-CM | POA: Diagnosis not present

## 2019-07-12 DIAGNOSIS — R29810 Facial weakness: Secondary | ICD-10-CM | POA: Diagnosis not present

## 2019-07-12 DIAGNOSIS — E042 Nontoxic multinodular goiter: Secondary | ICD-10-CM | POA: Diagnosis not present

## 2019-07-12 DIAGNOSIS — I10 Essential (primary) hypertension: Secondary | ICD-10-CM | POA: Diagnosis not present

## 2019-07-12 DIAGNOSIS — Z8349 Family history of other endocrine, nutritional and metabolic diseases: Secondary | ICD-10-CM | POA: Diagnosis not present

## 2019-07-12 DIAGNOSIS — Z5181 Encounter for therapeutic drug level monitoring: Secondary | ICD-10-CM | POA: Diagnosis not present

## 2019-07-12 DIAGNOSIS — Z8639 Personal history of other endocrine, nutritional and metabolic disease: Secondary | ICD-10-CM | POA: Diagnosis not present

## 2019-07-12 DIAGNOSIS — Z9889 Other specified postprocedural states: Secondary | ICD-10-CM | POA: Diagnosis not present

## 2019-07-12 DIAGNOSIS — Z7189 Other specified counseling: Secondary | ICD-10-CM | POA: Diagnosis not present

## 2019-09-21 ENCOUNTER — Encounter (HOSPITAL_COMMUNITY): Payer: Self-pay | Admitting: Emergency Medicine

## 2019-09-21 ENCOUNTER — Other Ambulatory Visit: Payer: Self-pay

## 2019-09-21 ENCOUNTER — Emergency Department (HOSPITAL_COMMUNITY)
Admission: EM | Admit: 2019-09-21 | Discharge: 2019-09-21 | Disposition: A | Discharge status: Home / Self Care | Payer: Medicare PPO | Attending: Emergency Medicine | Admitting: Emergency Medicine

## 2019-09-21 DIAGNOSIS — M25519 Pain in unspecified shoulder: Secondary | ICD-10-CM | POA: Diagnosis not present

## 2019-09-21 DIAGNOSIS — M549 Dorsalgia, unspecified: Secondary | ICD-10-CM | POA: Insufficient documentation

## 2019-09-21 DIAGNOSIS — M25562 Pain in left knee: Secondary | ICD-10-CM | POA: Insufficient documentation

## 2019-09-21 DIAGNOSIS — M25561 Pain in right knee: Secondary | ICD-10-CM | POA: Insufficient documentation

## 2019-09-21 DIAGNOSIS — M546 Pain in thoracic spine: Secondary | ICD-10-CM | POA: Diagnosis not present
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
## 2019-10-07 ENCOUNTER — Other Ambulatory Visit: Payer: Self-pay | Admitting: Family Medicine

## 2019-10-07 ENCOUNTER — Ambulatory Visit
Admission: RE | Admit: 2019-10-07 | Discharge: 2019-10-07 | Disposition: A | Discharge status: Home / Self Care | Payer: Medicare PPO | Source: Ambulatory Visit | Attending: Family Medicine | Admitting: Family Medicine

## 2019-10-07 ENCOUNTER — Other Ambulatory Visit: Payer: Self-pay

## 2019-10-07 DIAGNOSIS — M25561 Pain in right knee: Secondary | ICD-10-CM | POA: Diagnosis not present

## 2019-10-07 DIAGNOSIS — M79642 Pain in left hand: Secondary | ICD-10-CM | POA: Diagnosis not present

## 2019-10-07 DIAGNOSIS — M7989 Other specified soft tissue disorders: Secondary | ICD-10-CM | POA: Diagnosis not present

## 2019-10-07 DIAGNOSIS — S8991XA Unspecified injury of right lower leg, initial encounter: Secondary | ICD-10-CM | POA: Diagnosis not present

## 2019-10-07 DIAGNOSIS — S4992XA Unspecified injury of left shoulder and upper arm, initial encounter: Secondary | ICD-10-CM | POA: Diagnosis not present

## 2019-10-07 DIAGNOSIS — M25532 Pain in left wrist: Secondary | ICD-10-CM | POA: Diagnosis not present

## 2019-10-07 DIAGNOSIS — M542 Cervicalgia: Secondary | ICD-10-CM | POA: Diagnosis not present

## 2019-10-07 DIAGNOSIS — S6992XA Unspecified injury of left wrist, hand and finger(s), initial encounter: Secondary | ICD-10-CM | POA: Diagnosis not present

## 2019-10-07 DIAGNOSIS — S4991XA Unspecified injury of right shoulder and upper arm, initial encounter: Secondary | ICD-10-CM | POA: Diagnosis not present

## 2019-10-07 DIAGNOSIS — S199XXA Unspecified injury of neck, initial encounter: Secondary | ICD-10-CM | POA: Diagnosis not present

## 2019-10-07 DIAGNOSIS — S8992XA Unspecified injury of left lower leg, initial encounter: Secondary | ICD-10-CM | POA: Diagnosis not present

## 2019-10-07 DIAGNOSIS — M791 Myalgia, unspecified site: Secondary | ICD-10-CM | POA: Diagnosis not present
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
## 2019-10-19 DIAGNOSIS — E042 Nontoxic multinodular goiter: Secondary | ICD-10-CM | POA: Diagnosis not present

## 2019-10-20 DIAGNOSIS — M545 Low back pain: Secondary | ICD-10-CM | POA: Diagnosis not present

## 2019-10-27 DIAGNOSIS — R293 Abnormal posture: Secondary | ICD-10-CM | POA: Diagnosis not present

## 2019-10-27 DIAGNOSIS — M6281 Muscle weakness (generalized): Secondary | ICD-10-CM | POA: Diagnosis not present

## 2019-10-27 DIAGNOSIS — M542 Cervicalgia: Secondary | ICD-10-CM | POA: Diagnosis not present

## 2019-10-28 DIAGNOSIS — M542 Cervicalgia: Secondary | ICD-10-CM | POA: Diagnosis not present

## 2019-10-28 DIAGNOSIS — M545 Low back pain: Secondary | ICD-10-CM | POA: Diagnosis not present

## 2019-10-28 DIAGNOSIS — M6281 Muscle weakness (generalized): Secondary | ICD-10-CM | POA: Diagnosis not present

## 2019-10-28 DIAGNOSIS — M25512 Pain in left shoulder: Secondary | ICD-10-CM | POA: Diagnosis not present

## 2019-10-28 DIAGNOSIS — R293 Abnormal posture: Secondary | ICD-10-CM | POA: Diagnosis not present

## 2019-10-31 DIAGNOSIS — M6281 Muscle weakness (generalized): Secondary | ICD-10-CM | POA: Diagnosis not present

## 2019-10-31 DIAGNOSIS — M25512 Pain in left shoulder: Secondary | ICD-10-CM | POA: Diagnosis not present

## 2019-10-31 DIAGNOSIS — R293 Abnormal posture: Secondary | ICD-10-CM | POA: Diagnosis not present

## 2019-10-31 DIAGNOSIS — M542 Cervicalgia: Secondary | ICD-10-CM | POA: Diagnosis not present

## 2019-11-02 DIAGNOSIS — M542 Cervicalgia: Secondary | ICD-10-CM | POA: Diagnosis not present

## 2019-11-02 DIAGNOSIS — R293 Abnormal posture: Secondary | ICD-10-CM | POA: Diagnosis not present

## 2019-11-02 DIAGNOSIS — M25512 Pain in left shoulder: Secondary | ICD-10-CM | POA: Diagnosis not present

## 2019-11-02 DIAGNOSIS — M6281 Muscle weakness (generalized): Secondary | ICD-10-CM | POA: Diagnosis not present

## 2019-11-04 DIAGNOSIS — R293 Abnormal posture: Secondary | ICD-10-CM | POA: Diagnosis not present

## 2019-11-04 DIAGNOSIS — M542 Cervicalgia: Secondary | ICD-10-CM | POA: Diagnosis not present

## 2019-11-04 DIAGNOSIS — M6281 Muscle weakness (generalized): Secondary | ICD-10-CM | POA: Diagnosis not present

## 2019-11-04 DIAGNOSIS — M25512 Pain in left shoulder: Secondary | ICD-10-CM | POA: Diagnosis not present

## 2019-11-07 DIAGNOSIS — R293 Abnormal posture: Secondary | ICD-10-CM | POA: Diagnosis not present

## 2019-11-07 DIAGNOSIS — M542 Cervicalgia: Secondary | ICD-10-CM | POA: Diagnosis not present

## 2019-11-07 DIAGNOSIS — M25512 Pain in left shoulder: Secondary | ICD-10-CM | POA: Diagnosis not present

## 2019-11-07 DIAGNOSIS — M6281 Muscle weakness (generalized): Secondary | ICD-10-CM | POA: Diagnosis not present

## 2019-11-09 DIAGNOSIS — R293 Abnormal posture: Secondary | ICD-10-CM | POA: Diagnosis not present

## 2019-11-09 DIAGNOSIS — M6281 Muscle weakness (generalized): Secondary | ICD-10-CM | POA: Diagnosis not present

## 2019-11-09 DIAGNOSIS — M25512 Pain in left shoulder: Secondary | ICD-10-CM | POA: Diagnosis not present

## 2019-11-09 DIAGNOSIS — M542 Cervicalgia: Secondary | ICD-10-CM | POA: Diagnosis not present

## 2019-11-14 DIAGNOSIS — R293 Abnormal posture: Secondary | ICD-10-CM | POA: Diagnosis not present

## 2019-11-14 DIAGNOSIS — M25512 Pain in left shoulder: Secondary | ICD-10-CM | POA: Diagnosis not present

## 2019-11-14 DIAGNOSIS — M542 Cervicalgia: Secondary | ICD-10-CM | POA: Diagnosis not present

## 2019-11-14 DIAGNOSIS — M6281 Muscle weakness (generalized): Secondary | ICD-10-CM | POA: Diagnosis not present

## 2019-11-16 DIAGNOSIS — M542 Cervicalgia: Secondary | ICD-10-CM | POA: Diagnosis not present

## 2019-11-16 DIAGNOSIS — R293 Abnormal posture: Secondary | ICD-10-CM | POA: Diagnosis not present

## 2019-11-16 DIAGNOSIS — M25512 Pain in left shoulder: Secondary | ICD-10-CM | POA: Diagnosis not present

## 2019-11-16 DIAGNOSIS — M6281 Muscle weakness (generalized): Secondary | ICD-10-CM | POA: Diagnosis not present

## 2019-11-18 DIAGNOSIS — M6281 Muscle weakness (generalized): Secondary | ICD-10-CM | POA: Diagnosis not present

## 2019-11-18 DIAGNOSIS — M542 Cervicalgia: Secondary | ICD-10-CM | POA: Diagnosis not present

## 2019-11-18 DIAGNOSIS — M25512 Pain in left shoulder: Secondary | ICD-10-CM | POA: Diagnosis not present

## 2019-11-18 DIAGNOSIS — R293 Abnormal posture: Secondary | ICD-10-CM | POA: Diagnosis not present

## 2019-11-21 DIAGNOSIS — M6281 Muscle weakness (generalized): Secondary | ICD-10-CM | POA: Diagnosis not present

## 2019-11-21 DIAGNOSIS — R293 Abnormal posture: Secondary | ICD-10-CM | POA: Diagnosis not present

## 2019-11-21 DIAGNOSIS — M542 Cervicalgia: Secondary | ICD-10-CM | POA: Diagnosis not present

## 2019-11-21 DIAGNOSIS — M25512 Pain in left shoulder: Secondary | ICD-10-CM | POA: Diagnosis not present

## 2019-11-21 DIAGNOSIS — M545 Low back pain: Secondary | ICD-10-CM | POA: Diagnosis not present

## 2019-11-23 DIAGNOSIS — R293 Abnormal posture: Secondary | ICD-10-CM | POA: Diagnosis not present

## 2019-11-23 DIAGNOSIS — M25512 Pain in left shoulder: Secondary | ICD-10-CM | POA: Diagnosis not present

## 2019-11-23 DIAGNOSIS — M6281 Muscle weakness (generalized): Secondary | ICD-10-CM | POA: Diagnosis not present

## 2019-11-23 DIAGNOSIS — M542 Cervicalgia: Secondary | ICD-10-CM | POA: Diagnosis not present

## 2019-11-28 DIAGNOSIS — M542 Cervicalgia: Secondary | ICD-10-CM | POA: Diagnosis not present

## 2019-11-28 DIAGNOSIS — M25512 Pain in left shoulder: Secondary | ICD-10-CM | POA: Diagnosis not present

## 2019-11-28 DIAGNOSIS — R293 Abnormal posture: Secondary | ICD-10-CM | POA: Diagnosis not present

## 2019-11-28 DIAGNOSIS — M6281 Muscle weakness (generalized): Secondary | ICD-10-CM | POA: Diagnosis not present

## 2019-11-30 DIAGNOSIS — R293 Abnormal posture: Secondary | ICD-10-CM | POA: Diagnosis not present

## 2019-11-30 DIAGNOSIS — M25512 Pain in left shoulder: Secondary | ICD-10-CM | POA: Diagnosis not present

## 2019-11-30 DIAGNOSIS — M6281 Muscle weakness (generalized): Secondary | ICD-10-CM | POA: Diagnosis not present

## 2019-11-30 DIAGNOSIS — M542 Cervicalgia: Secondary | ICD-10-CM | POA: Diagnosis not present

## 2019-12-05 DIAGNOSIS — M25512 Pain in left shoulder: Secondary | ICD-10-CM | POA: Diagnosis not present

## 2019-12-05 DIAGNOSIS — M6281 Muscle weakness (generalized): Secondary | ICD-10-CM | POA: Diagnosis not present

## 2019-12-05 DIAGNOSIS — M542 Cervicalgia: Secondary | ICD-10-CM | POA: Diagnosis not present

## 2019-12-05 DIAGNOSIS — R293 Abnormal posture: Secondary | ICD-10-CM | POA: Diagnosis not present

## 2019-12-12 DIAGNOSIS — M25512 Pain in left shoulder: Secondary | ICD-10-CM | POA: Diagnosis not present

## 2019-12-12 DIAGNOSIS — M6281 Muscle weakness (generalized): Secondary | ICD-10-CM | POA: Diagnosis not present

## 2019-12-12 DIAGNOSIS — M542 Cervicalgia: Secondary | ICD-10-CM | POA: Diagnosis not present

## 2019-12-12 DIAGNOSIS — R293 Abnormal posture: Secondary | ICD-10-CM | POA: Diagnosis not present

## 2019-12-22 DIAGNOSIS — G510 Bell's palsy: Secondary | ICD-10-CM | POA: Diagnosis not present

## 2019-12-22 DIAGNOSIS — E041 Nontoxic single thyroid nodule: Secondary | ICD-10-CM | POA: Diagnosis not present

## 2019-12-22 DIAGNOSIS — Z1231 Encounter for screening mammogram for malignant neoplasm of breast: Secondary | ICD-10-CM | POA: Diagnosis not present

## 2019-12-22 DIAGNOSIS — Z01419 Encounter for gynecological examination (general) (routine) without abnormal findings: Secondary | ICD-10-CM | POA: Diagnosis not present

## 2020-01-02 DIAGNOSIS — M545 Low back pain: Secondary | ICD-10-CM | POA: Diagnosis not present

## 2020-01-11 DIAGNOSIS — M8580 Other specified disorders of bone density and structure, unspecified site: Secondary | ICD-10-CM | POA: Diagnosis not present

## 2020-01-11 DIAGNOSIS — M8588 Other specified disorders of bone density and structure, other site: Secondary | ICD-10-CM | POA: Diagnosis not present

## 2020-02-06 DIAGNOSIS — G510 Bell's palsy: Secondary | ICD-10-CM | POA: Diagnosis not present

## 2020-02-06 DIAGNOSIS — R7303 Prediabetes: Secondary | ICD-10-CM | POA: Diagnosis not present

## 2020-02-06 DIAGNOSIS — R739 Hyperglycemia, unspecified: Secondary | ICD-10-CM | POA: Diagnosis not present

## 2020-02-06 DIAGNOSIS — E785 Hyperlipidemia, unspecified: Secondary | ICD-10-CM | POA: Diagnosis not present

## 2020-02-06 DIAGNOSIS — Z23 Encounter for immunization: Secondary | ICD-10-CM | POA: Diagnosis not present

## 2020-02-06 DIAGNOSIS — E039 Hypothyroidism, unspecified: Secondary | ICD-10-CM | POA: Diagnosis not present

## 2020-02-06 DIAGNOSIS — I10 Essential (primary) hypertension: Secondary | ICD-10-CM | POA: Diagnosis not present

## 2020-03-06 ENCOUNTER — Ambulatory Visit: Payer: Medicare PPO | Admitting: Neurology

## 2020-03-19 DIAGNOSIS — I10 Essential (primary) hypertension: Secondary | ICD-10-CM | POA: Diagnosis not present

## 2020-03-19 DIAGNOSIS — R739 Hyperglycemia, unspecified: Secondary | ICD-10-CM | POA: Diagnosis not present

## 2020-03-19 DIAGNOSIS — Z23 Encounter for immunization: Secondary | ICD-10-CM | POA: Diagnosis not present

## 2020-03-19 DIAGNOSIS — E785 Hyperlipidemia, unspecified: Secondary | ICD-10-CM | POA: Diagnosis not present

## 2020-05-08 DIAGNOSIS — H524 Presbyopia: Secondary | ICD-10-CM | POA: Diagnosis not present

## 2020-05-08 DIAGNOSIS — H25013 Cortical age-related cataract, bilateral: Secondary | ICD-10-CM | POA: Diagnosis not present

## 2020-05-08 DIAGNOSIS — H2513 Age-related nuclear cataract, bilateral: Secondary | ICD-10-CM | POA: Diagnosis not present

## 2020-05-08 DIAGNOSIS — H52203 Unspecified astigmatism, bilateral: Secondary | ICD-10-CM | POA: Diagnosis not present

## 2020-05-08 DIAGNOSIS — H5213 Myopia, bilateral: Secondary | ICD-10-CM | POA: Diagnosis not present

## 2020-05-09 ENCOUNTER — Ambulatory Visit: Payer: Medicare PPO | Admitting: Neurology

## 2020-05-31 DIAGNOSIS — R7303 Prediabetes: Secondary | ICD-10-CM | POA: Diagnosis not present

## 2020-05-31 DIAGNOSIS — I10 Essential (primary) hypertension: Secondary | ICD-10-CM | POA: Diagnosis not present

## 2020-05-31 DIAGNOSIS — G510 Bell's palsy: Secondary | ICD-10-CM | POA: Diagnosis not present

## 2020-05-31 DIAGNOSIS — E782 Mixed hyperlipidemia: Secondary | ICD-10-CM | POA: Diagnosis not present

## 2020-08-20 DIAGNOSIS — H524 Presbyopia: Secondary | ICD-10-CM | POA: Diagnosis not present

## 2020-08-20 DIAGNOSIS — H2511 Age-related nuclear cataract, right eye: Secondary | ICD-10-CM | POA: Diagnosis not present

## 2020-08-20 DIAGNOSIS — H35013 Changes in retinal vascular appearance, bilateral: Secondary | ICD-10-CM | POA: Diagnosis not present

## 2020-08-20 DIAGNOSIS — H35033 Hypertensive retinopathy, bilateral: Secondary | ICD-10-CM | POA: Diagnosis not present

## 2020-08-20 DIAGNOSIS — H2513 Age-related nuclear cataract, bilateral: Secondary | ICD-10-CM | POA: Diagnosis not present

## 2020-08-20 DIAGNOSIS — H25013 Cortical age-related cataract, bilateral: Secondary | ICD-10-CM | POA: Diagnosis not present

## 2020-08-27 ENCOUNTER — Other Ambulatory Visit: Payer: Self-pay | Admitting: Family Medicine

## 2020-08-27 DIAGNOSIS — A609 Anogenital herpesviral infection, unspecified: Secondary | ICD-10-CM | POA: Diagnosis not present

## 2020-08-27 DIAGNOSIS — I10 Essential (primary) hypertension: Secondary | ICD-10-CM | POA: Diagnosis not present

## 2020-08-27 DIAGNOSIS — R221 Localized swelling, mass and lump, neck: Secondary | ICD-10-CM

## 2020-08-27 DIAGNOSIS — Z85850 Personal history of malignant neoplasm of thyroid: Secondary | ICD-10-CM

## 2020-08-27 DIAGNOSIS — E039 Hypothyroidism, unspecified: Secondary | ICD-10-CM | POA: Diagnosis not present

## 2020-08-27 DIAGNOSIS — G510 Bell's palsy: Secondary | ICD-10-CM | POA: Diagnosis not present

## 2020-08-27 DIAGNOSIS — Z0000 Encounter for general adult medical examination without abnormal findings: Secondary | ICD-10-CM | POA: Diagnosis not present

## 2020-08-27 DIAGNOSIS — R591 Generalized enlarged lymph nodes: Secondary | ICD-10-CM

## 2020-08-27 DIAGNOSIS — R739 Hyperglycemia, unspecified: Secondary | ICD-10-CM | POA: Diagnosis not present

## 2020-08-27 DIAGNOSIS — R600 Localized edema: Secondary | ICD-10-CM | POA: Diagnosis not present

## 2020-09-04 DIAGNOSIS — H25811 Combined forms of age-related cataract, right eye: Secondary | ICD-10-CM | POA: Diagnosis not present

## 2020-09-04 DIAGNOSIS — H2511 Age-related nuclear cataract, right eye: Secondary | ICD-10-CM | POA: Diagnosis not present

## 2020-09-06 ENCOUNTER — Inpatient Hospital Stay: Admission: RE | Admit: 2020-09-06 | Payer: Medicare PPO | Source: Ambulatory Visit

## 2020-09-11 ENCOUNTER — Other Ambulatory Visit: Payer: Medicare PPO

## 2020-09-21 ENCOUNTER — Other Ambulatory Visit: Payer: Medicare PPO

## 2020-09-24 DIAGNOSIS — H25012 Cortical age-related cataract, left eye: Secondary | ICD-10-CM | POA: Diagnosis not present

## 2020-09-24 DIAGNOSIS — H2512 Age-related nuclear cataract, left eye: Secondary | ICD-10-CM | POA: Diagnosis not present

## 2020-10-02 DIAGNOSIS — H25012 Cortical age-related cataract, left eye: Secondary | ICD-10-CM | POA: Diagnosis not present

## 2020-10-02 DIAGNOSIS — H2512 Age-related nuclear cataract, left eye: Secondary | ICD-10-CM | POA: Diagnosis not present

## 2020-10-02 DIAGNOSIS — H25812 Combined forms of age-related cataract, left eye: Secondary | ICD-10-CM | POA: Diagnosis not present

## 2020-11-01 ENCOUNTER — Ambulatory Visit
Admission: RE | Admit: 2020-11-01 | Discharge: 2020-11-01 | Disposition: A | Discharge status: Home / Self Care | Payer: Medicare PPO | Source: Ambulatory Visit | Attending: Family Medicine | Admitting: Family Medicine

## 2020-11-01 DIAGNOSIS — R221 Localized swelling, mass and lump, neck: Secondary | ICD-10-CM | POA: Diagnosis not present

## 2020-11-01 DIAGNOSIS — J3489 Other specified disorders of nose and nasal sinuses: Secondary | ICD-10-CM | POA: Diagnosis not present

## 2020-11-01 MED ORDER — IOPAMIDOL (ISOVUE-300) INJECTION 61%
75.0000 mL | Freq: Once | INTRAVENOUS | Status: AC | PRN
  Administered 2020-11-01: 75 mL via INTRAVENOUS

## 2020-12-19 DIAGNOSIS — Z6828 Body mass index (BMI) 28.0-28.9, adult: Secondary | ICD-10-CM | POA: Diagnosis not present

## 2020-12-19 DIAGNOSIS — I10 Essential (primary) hypertension: Secondary | ICD-10-CM | POA: Diagnosis not present

## 2020-12-19 DIAGNOSIS — E663 Overweight: Secondary | ICD-10-CM | POA: Diagnosis not present

## 2020-12-19 DIAGNOSIS — X100XXA Contact with hot drinks, initial encounter: Secondary | ICD-10-CM | POA: Diagnosis not present

## 2020-12-19 DIAGNOSIS — T22232A Burn of second degree of left upper arm, initial encounter: Secondary | ICD-10-CM | POA: Diagnosis not present

## 2020-12-25 DIAGNOSIS — T22219A Burn of second degree of unspecified forearm, initial encounter: Secondary | ICD-10-CM | POA: Diagnosis not present

## 2021-01-04 DIAGNOSIS — T22219D Burn of second degree of unspecified forearm, subsequent encounter: Secondary | ICD-10-CM | POA: Diagnosis not present

## 2021-02-04 DIAGNOSIS — E782 Mixed hyperlipidemia: Secondary | ICD-10-CM | POA: Diagnosis not present

## 2021-02-04 DIAGNOSIS — T22011D Burn of unspecified degree of right forearm, subsequent encounter: Secondary | ICD-10-CM | POA: Diagnosis not present

## 2021-02-04 DIAGNOSIS — R600 Localized edema: Secondary | ICD-10-CM | POA: Diagnosis not present

## 2021-02-04 DIAGNOSIS — I10 Essential (primary) hypertension: Secondary | ICD-10-CM | POA: Diagnosis not present

## 2021-02-11 DIAGNOSIS — Z1231 Encounter for screening mammogram for malignant neoplasm of breast: Secondary | ICD-10-CM | POA: Diagnosis not present

## 2021-02-11 DIAGNOSIS — R7989 Other specified abnormal findings of blood chemistry: Secondary | ICD-10-CM | POA: Diagnosis not present

## 2021-02-11 DIAGNOSIS — Z01419 Encounter for gynecological examination (general) (routine) without abnormal findings: Secondary | ICD-10-CM | POA: Diagnosis not present

## 2021-02-11 DIAGNOSIS — E041 Nontoxic single thyroid nodule: Secondary | ICD-10-CM | POA: Diagnosis not present

## 2021-02-11 DIAGNOSIS — L989 Disorder of the skin and subcutaneous tissue, unspecified: Secondary | ICD-10-CM | POA: Diagnosis not present

## 2021-02-11 DIAGNOSIS — R7309 Other abnormal glucose: Secondary | ICD-10-CM | POA: Diagnosis not present

## 2021-02-11 DIAGNOSIS — E559 Vitamin D deficiency, unspecified: Secondary | ICD-10-CM | POA: Diagnosis not present

## 2021-02-11 DIAGNOSIS — R718 Other abnormality of red blood cells: Secondary | ICD-10-CM | POA: Diagnosis not present

## 2021-02-27 DIAGNOSIS — E0590 Thyrotoxicosis, unspecified without thyrotoxic crisis or storm: Secondary | ICD-10-CM | POA: Diagnosis not present

## 2021-02-27 DIAGNOSIS — I10 Essential (primary) hypertension: Secondary | ICD-10-CM | POA: Diagnosis not present

## 2021-02-27 DIAGNOSIS — E049 Nontoxic goiter, unspecified: Secondary | ICD-10-CM | POA: Diagnosis not present

## 2021-02-27 DIAGNOSIS — Z23 Encounter for immunization: Secondary | ICD-10-CM | POA: Diagnosis not present

## 2021-02-27 DIAGNOSIS — K111 Hypertrophy of salivary gland: Secondary | ICD-10-CM | POA: Diagnosis not present

## 2021-03-08 DIAGNOSIS — L821 Other seborrheic keratosis: Secondary | ICD-10-CM | POA: Diagnosis not present

## 2021-03-08 DIAGNOSIS — T22232A Burn of second degree of left upper arm, initial encounter: Secondary | ICD-10-CM | POA: Diagnosis not present

## 2021-04-26 DIAGNOSIS — E0590 Thyrotoxicosis, unspecified without thyrotoxic crisis or storm: Secondary | ICD-10-CM | POA: Diagnosis not present

## 2021-05-01 ENCOUNTER — Other Ambulatory Visit (HOSPITAL_COMMUNITY): Payer: Self-pay | Admitting: Endocrinology

## 2021-05-02 ENCOUNTER — Other Ambulatory Visit: Payer: Self-pay | Admitting: Endocrinology

## 2021-05-02 ENCOUNTER — Other Ambulatory Visit (HOSPITAL_COMMUNITY): Payer: Self-pay | Admitting: Endocrinology

## 2021-05-02 DIAGNOSIS — G510 Bell's palsy: Secondary | ICD-10-CM | POA: Diagnosis not present

## 2021-05-02 DIAGNOSIS — H35033 Hypertensive retinopathy, bilateral: Secondary | ICD-10-CM | POA: Diagnosis not present

## 2021-05-02 DIAGNOSIS — E0590 Thyrotoxicosis, unspecified without thyrotoxic crisis or storm: Secondary | ICD-10-CM

## 2021-05-02 DIAGNOSIS — H3581 Retinal edema: Secondary | ICD-10-CM | POA: Diagnosis not present

## 2021-05-02 DIAGNOSIS — H524 Presbyopia: Secondary | ICD-10-CM | POA: Diagnosis not present

## 2021-05-02 DIAGNOSIS — H3562 Retinal hemorrhage, left eye: Secondary | ICD-10-CM | POA: Diagnosis not present

## 2021-05-02 DIAGNOSIS — H04123 Dry eye syndrome of bilateral lacrimal glands: Secondary | ICD-10-CM | POA: Diagnosis not present

## 2021-05-06 DIAGNOSIS — E0541 Thyrotoxicosis factitia with thyrotoxic crisis or storm: Secondary | ICD-10-CM | POA: Diagnosis not present

## 2021-05-06 DIAGNOSIS — E040 Nontoxic diffuse goiter: Secondary | ICD-10-CM | POA: Diagnosis not present

## 2021-05-06 DIAGNOSIS — K111 Hypertrophy of salivary gland: Secondary | ICD-10-CM | POA: Diagnosis not present

## 2021-05-06 DIAGNOSIS — R635 Abnormal weight gain: Secondary | ICD-10-CM | POA: Diagnosis not present

## 2021-05-06 DIAGNOSIS — I10 Essential (primary) hypertension: Secondary | ICD-10-CM | POA: Diagnosis not present

## 2021-05-14 ENCOUNTER — Encounter (HOSPITAL_COMMUNITY): Payer: Medicare PPO | Attending: Endocrinology

## 2021-05-14 ENCOUNTER — Encounter (HOSPITAL_COMMUNITY): Payer: Medicare PPO

## 2021-05-14 ENCOUNTER — Encounter (HOSPITAL_COMMUNITY): Payer: Self-pay

## 2021-05-15 ENCOUNTER — Encounter (HOSPITAL_COMMUNITY): Payer: Medicare PPO

## 2021-06-03 DIAGNOSIS — D490 Neoplasm of unspecified behavior of digestive system: Secondary | ICD-10-CM | POA: Diagnosis not present

## 2021-06-26 DIAGNOSIS — E0590 Thyrotoxicosis, unspecified without thyrotoxic crisis or storm: Secondary | ICD-10-CM | POA: Diagnosis not present

## 2021-07-02 DIAGNOSIS — I10 Essential (primary) hypertension: Secondary | ICD-10-CM | POA: Diagnosis not present

## 2021-07-02 DIAGNOSIS — E0590 Thyrotoxicosis, unspecified without thyrotoxic crisis or storm: Secondary | ICD-10-CM | POA: Diagnosis not present

## 2021-07-02 DIAGNOSIS — E049 Nontoxic goiter, unspecified: Secondary | ICD-10-CM | POA: Diagnosis not present

## 2021-10-30 DIAGNOSIS — E039 Hypothyroidism, unspecified: Secondary | ICD-10-CM | POA: Diagnosis not present

## 2021-10-30 DIAGNOSIS — E0590 Thyrotoxicosis, unspecified without thyrotoxic crisis or storm: Secondary | ICD-10-CM | POA: Diagnosis not present

## 2021-11-06 DIAGNOSIS — E049 Nontoxic goiter, unspecified: Secondary | ICD-10-CM | POA: Diagnosis not present

## 2021-11-06 DIAGNOSIS — I10 Essential (primary) hypertension: Secondary | ICD-10-CM | POA: Diagnosis not present

## 2021-11-06 DIAGNOSIS — E0590 Thyrotoxicosis, unspecified without thyrotoxic crisis or storm: Secondary | ICD-10-CM | POA: Diagnosis not present

## 2021-11-15 DIAGNOSIS — E039 Hypothyroidism, unspecified: Secondary | ICD-10-CM | POA: Diagnosis not present

## 2021-11-15 DIAGNOSIS — I10 Essential (primary) hypertension: Secondary | ICD-10-CM | POA: Diagnosis not present

## 2021-11-15 DIAGNOSIS — J399 Disease of upper respiratory tract, unspecified: Secondary | ICD-10-CM | POA: Diagnosis not present

## 2021-11-15 DIAGNOSIS — Z6835 Body mass index (BMI) 35.0-35.9, adult: Secondary | ICD-10-CM | POA: Diagnosis not present

## 2021-11-15 DIAGNOSIS — R739 Hyperglycemia, unspecified: Secondary | ICD-10-CM | POA: Diagnosis not present

## 2022-01-07 DIAGNOSIS — E0590 Thyrotoxicosis, unspecified without thyrotoxic crisis or storm: Secondary | ICD-10-CM | POA: Diagnosis not present

## 2022-02-17 DIAGNOSIS — E1169 Type 2 diabetes mellitus with other specified complication: Secondary | ICD-10-CM | POA: Diagnosis not present

## 2022-02-17 DIAGNOSIS — J399 Disease of upper respiratory tract, unspecified: Secondary | ICD-10-CM | POA: Diagnosis not present

## 2022-02-17 DIAGNOSIS — E039 Hypothyroidism, unspecified: Secondary | ICD-10-CM | POA: Diagnosis not present

## 2022-02-17 DIAGNOSIS — Z6832 Body mass index (BMI) 32.0-32.9, adult: Secondary | ICD-10-CM | POA: Diagnosis not present

## 2022-02-17 DIAGNOSIS — E785 Hyperlipidemia, unspecified: Secondary | ICD-10-CM | POA: Diagnosis not present

## 2022-02-17 DIAGNOSIS — E782 Mixed hyperlipidemia: Secondary | ICD-10-CM | POA: Diagnosis not present

## 2022-02-17 DIAGNOSIS — R634 Abnormal weight loss: Secondary | ICD-10-CM | POA: Diagnosis not present

## 2022-02-17 DIAGNOSIS — I10 Essential (primary) hypertension: Secondary | ICD-10-CM | POA: Diagnosis not present

## 2022-03-18 DIAGNOSIS — E559 Vitamin D deficiency, unspecified: Secondary | ICD-10-CM | POA: Diagnosis not present

## 2022-03-18 DIAGNOSIS — Z01419 Encounter for gynecological examination (general) (routine) without abnormal findings: Secondary | ICD-10-CM | POA: Diagnosis not present

## 2022-03-18 DIAGNOSIS — L989 Disorder of the skin and subcutaneous tissue, unspecified: Secondary | ICD-10-CM | POA: Diagnosis not present

## 2022-05-29 DIAGNOSIS — E0590 Thyrotoxicosis, unspecified without thyrotoxic crisis or storm: Secondary | ICD-10-CM | POA: Diagnosis not present

## 2022-06-02 DIAGNOSIS — E049 Nontoxic goiter, unspecified: Secondary | ICD-10-CM | POA: Diagnosis not present

## 2022-06-02 DIAGNOSIS — I10 Essential (primary) hypertension: Secondary | ICD-10-CM | POA: Diagnosis not present

## 2022-06-02 DIAGNOSIS — K111 Hypertrophy of salivary gland: Secondary | ICD-10-CM | POA: Diagnosis not present

## 2022-06-02 DIAGNOSIS — E0590 Thyrotoxicosis, unspecified without thyrotoxic crisis or storm: Secondary | ICD-10-CM | POA: Diagnosis not present

## 2022-06-30 DIAGNOSIS — H35013 Changes in retinal vascular appearance, bilateral: Secondary | ICD-10-CM | POA: Diagnosis not present

## 2022-06-30 DIAGNOSIS — H524 Presbyopia: Secondary | ICD-10-CM | POA: Diagnosis not present

## 2022-06-30 DIAGNOSIS — G510 Bell's palsy: Secondary | ICD-10-CM | POA: Diagnosis not present

## 2022-06-30 DIAGNOSIS — H35033 Hypertensive retinopathy, bilateral: Secondary | ICD-10-CM | POA: Diagnosis not present

## 2022-06-30 DIAGNOSIS — H04123 Dry eye syndrome of bilateral lacrimal glands: Secondary | ICD-10-CM | POA: Diagnosis not present

## 2022-08-05 DIAGNOSIS — I10 Essential (primary) hypertension: Secondary | ICD-10-CM | POA: Diagnosis not present

## 2022-08-05 DIAGNOSIS — E785 Hyperlipidemia, unspecified: Secondary | ICD-10-CM | POA: Diagnosis not present

## 2022-08-11 DIAGNOSIS — E0590 Thyrotoxicosis, unspecified without thyrotoxic crisis or storm: Secondary | ICD-10-CM | POA: Diagnosis not present

## 2022-08-18 DIAGNOSIS — E0590 Thyrotoxicosis, unspecified without thyrotoxic crisis or storm: Secondary | ICD-10-CM | POA: Diagnosis not present

## 2022-08-18 DIAGNOSIS — E049 Nontoxic goiter, unspecified: Secondary | ICD-10-CM | POA: Diagnosis not present

## 2022-08-18 DIAGNOSIS — I10 Essential (primary) hypertension: Secondary | ICD-10-CM | POA: Diagnosis not present

## 2022-08-19 ENCOUNTER — Other Ambulatory Visit: Payer: Self-pay | Admitting: Endocrinology

## 2022-08-19 DIAGNOSIS — E049 Nontoxic goiter, unspecified: Secondary | ICD-10-CM

## 2022-09-03 ENCOUNTER — Other Ambulatory Visit: Payer: Self-pay | Admitting: Endocrinology

## 2022-09-03 DIAGNOSIS — E049 Nontoxic goiter, unspecified: Secondary | ICD-10-CM

## 2022-09-23 ENCOUNTER — Ambulatory Visit
Admission: RE | Admit: 2022-09-23 | Discharge: 2022-09-23 | Disposition: A | Discharge status: Home / Self Care | Payer: Medicare PPO | Source: Ambulatory Visit | Attending: Endocrinology | Admitting: Endocrinology

## 2022-09-23 DIAGNOSIS — E049 Nontoxic goiter, unspecified: Secondary | ICD-10-CM

## 2022-09-23 DIAGNOSIS — R911 Solitary pulmonary nodule: Secondary | ICD-10-CM | POA: Diagnosis not present

## 2022-09-29 ENCOUNTER — Other Ambulatory Visit (HOSPITAL_COMMUNITY): Payer: Self-pay | Admitting: Endocrinology

## 2022-09-29 DIAGNOSIS — E0590 Thyrotoxicosis, unspecified without thyrotoxic crisis or storm: Secondary | ICD-10-CM

## 2022-10-21 ENCOUNTER — Encounter (HOSPITAL_COMMUNITY)
Admission: RE | Admit: 2022-10-21 | Discharge: 2022-10-21 | Disposition: A | Discharge status: Home / Self Care | Payer: Medicare PPO | Source: Ambulatory Visit | Attending: Endocrinology | Admitting: Endocrinology

## 2022-10-21 DIAGNOSIS — E0590 Thyrotoxicosis, unspecified without thyrotoxic crisis or storm: Secondary | ICD-10-CM

## 2022-10-21 MED ORDER — SODIUM IODIDE I-123 7.4 MBQ CAPS
481.0000 | ORAL_CAPSULE | Freq: Once | ORAL | Status: AC
  Administered 2022-10-21: 481 via ORAL

## 2022-10-22 ENCOUNTER — Encounter (HOSPITAL_COMMUNITY)
Admission: RE | Admit: 2022-10-22 | Discharge: 2022-10-22 | Disposition: A | Discharge status: Home / Self Care | Payer: Medicare PPO | Source: Ambulatory Visit | Attending: Endocrinology | Admitting: Endocrinology

## 2022-10-22 DIAGNOSIS — E041 Nontoxic single thyroid nodule: Secondary | ICD-10-CM | POA: Diagnosis not present

## 2022-10-29 DIAGNOSIS — E0590 Thyrotoxicosis, unspecified without thyrotoxic crisis or storm: Secondary | ICD-10-CM | POA: Diagnosis not present

## 2022-11-04 ENCOUNTER — Other Ambulatory Visit: Payer: Self-pay | Admitting: Endocrinology

## 2022-11-04 DIAGNOSIS — I10 Essential (primary) hypertension: Secondary | ICD-10-CM | POA: Diagnosis not present

## 2022-11-04 DIAGNOSIS — E0590 Thyrotoxicosis, unspecified without thyrotoxic crisis or storm: Secondary | ICD-10-CM | POA: Diagnosis not present

## 2022-11-04 DIAGNOSIS — E049 Nontoxic goiter, unspecified: Secondary | ICD-10-CM | POA: Diagnosis not present

## 2022-11-13 DIAGNOSIS — E7800 Pure hypercholesterolemia, unspecified: Secondary | ICD-10-CM | POA: Diagnosis not present

## 2022-11-13 DIAGNOSIS — Z6830 Body mass index (BMI) 30.0-30.9, adult: Secondary | ICD-10-CM | POA: Diagnosis not present

## 2022-11-13 DIAGNOSIS — E038 Other specified hypothyroidism: Secondary | ICD-10-CM | POA: Diagnosis not present

## 2022-11-13 DIAGNOSIS — Z20822 Contact with and (suspected) exposure to covid-19: Secondary | ICD-10-CM | POA: Diagnosis not present

## 2022-11-13 DIAGNOSIS — I10 Essential (primary) hypertension: Secondary | ICD-10-CM | POA: Diagnosis not present

## 2023-03-23 DIAGNOSIS — Z6830 Body mass index (BMI) 30.0-30.9, adult: Secondary | ICD-10-CM | POA: Diagnosis not present

## 2023-03-23 DIAGNOSIS — L989 Disorder of the skin and subcutaneous tissue, unspecified: Secondary | ICD-10-CM | POA: Diagnosis not present

## 2023-03-23 DIAGNOSIS — Z01419 Encounter for gynecological examination (general) (routine) without abnormal findings: Secondary | ICD-10-CM | POA: Diagnosis not present

## 2023-03-23 DIAGNOSIS — Z1231 Encounter for screening mammogram for malignant neoplasm of breast: Secondary | ICD-10-CM | POA: Diagnosis not present

## 2023-03-23 DIAGNOSIS — E559 Vitamin D deficiency, unspecified: Secondary | ICD-10-CM | POA: Diagnosis not present

## 2023-05-06 DIAGNOSIS — E0590 Thyrotoxicosis, unspecified without thyrotoxic crisis or storm: Secondary | ICD-10-CM | POA: Diagnosis not present

## 2023-05-11 DIAGNOSIS — I6523 Occlusion and stenosis of bilateral carotid arteries: Secondary | ICD-10-CM | POA: Diagnosis not present

## 2023-05-11 DIAGNOSIS — E042 Nontoxic multinodular goiter: Secondary | ICD-10-CM | POA: Diagnosis not present

## 2023-05-11 DIAGNOSIS — M47816 Spondylosis without myelopathy or radiculopathy, lumbar region: Secondary | ICD-10-CM | POA: Diagnosis not present

## 2023-05-11 DIAGNOSIS — M25551 Pain in right hip: Secondary | ICD-10-CM | POA: Diagnosis not present

## 2023-05-11 DIAGNOSIS — R296 Repeated falls: Secondary | ICD-10-CM | POA: Diagnosis not present

## 2023-05-11 DIAGNOSIS — R600 Localized edema: Secondary | ICD-10-CM | POA: Diagnosis not present

## 2023-05-11 DIAGNOSIS — R531 Weakness: Secondary | ICD-10-CM | POA: Diagnosis not present

## 2023-05-11 DIAGNOSIS — Z043 Encounter for examination and observation following other accident: Secondary | ICD-10-CM | POA: Diagnosis not present

## 2023-05-13 DIAGNOSIS — E7800 Pure hypercholesterolemia, unspecified: Secondary | ICD-10-CM | POA: Diagnosis not present

## 2023-05-13 DIAGNOSIS — R29898 Other symptoms and signs involving the musculoskeletal system: Secondary | ICD-10-CM | POA: Diagnosis not present

## 2023-05-13 DIAGNOSIS — S80821A Blister (nonthermal), right lower leg, initial encounter: Secondary | ICD-10-CM | POA: Diagnosis not present

## 2023-05-13 DIAGNOSIS — E0590 Thyrotoxicosis, unspecified without thyrotoxic crisis or storm: Secondary | ICD-10-CM | POA: Diagnosis not present

## 2023-05-13 DIAGNOSIS — E049 Nontoxic goiter, unspecified: Secondary | ICD-10-CM | POA: Diagnosis not present

## 2023-05-13 DIAGNOSIS — R296 Repeated falls: Secondary | ICD-10-CM | POA: Diagnosis not present

## 2023-05-13 DIAGNOSIS — I10 Essential (primary) hypertension: Secondary | ICD-10-CM | POA: Diagnosis not present

## 2023-05-13 DIAGNOSIS — Z8669 Personal history of other diseases of the nervous system and sense organs: Secondary | ICD-10-CM | POA: Diagnosis not present

## 2023-05-19 DIAGNOSIS — E785 Hyperlipidemia, unspecified: Secondary | ICD-10-CM | POA: Diagnosis not present

## 2023-05-19 DIAGNOSIS — I10 Essential (primary) hypertension: Secondary | ICD-10-CM | POA: Diagnosis not present

## 2023-05-19 DIAGNOSIS — I69398 Other sequelae of cerebral infarction: Secondary | ICD-10-CM | POA: Diagnosis not present

## 2023-05-19 DIAGNOSIS — R29898 Other symptoms and signs involving the musculoskeletal system: Secondary | ICD-10-CM | POA: Diagnosis not present

## 2023-05-19 DIAGNOSIS — E042 Nontoxic multinodular goiter: Secondary | ICD-10-CM | POA: Diagnosis not present

## 2023-05-19 DIAGNOSIS — S80821D Blister (nonthermal), right lower leg, subsequent encounter: Secondary | ICD-10-CM | POA: Diagnosis not present

## 2023-05-19 DIAGNOSIS — Z8669 Personal history of other diseases of the nervous system and sense organs: Secondary | ICD-10-CM | POA: Diagnosis not present

## 2023-05-19 DIAGNOSIS — Z7982 Long term (current) use of aspirin: Secondary | ICD-10-CM | POA: Diagnosis not present

## 2023-05-19 DIAGNOSIS — R296 Repeated falls: Secondary | ICD-10-CM | POA: Diagnosis not present

## 2023-05-30 DIAGNOSIS — I10 Essential (primary) hypertension: Secondary | ICD-10-CM | POA: Diagnosis not present

## 2023-05-30 DIAGNOSIS — R739 Hyperglycemia, unspecified: Secondary | ICD-10-CM | POA: Diagnosis not present

## 2023-05-30 DIAGNOSIS — E785 Hyperlipidemia, unspecified: Secondary | ICD-10-CM | POA: Diagnosis not present

## 2023-05-30 DIAGNOSIS — M6281 Muscle weakness (generalized): Secondary | ICD-10-CM | POA: Diagnosis not present

## 2023-06-01 ENCOUNTER — Other Ambulatory Visit: Payer: Self-pay | Admitting: Family Medicine

## 2023-06-01 DIAGNOSIS — E049 Nontoxic goiter, unspecified: Secondary | ICD-10-CM

## 2023-06-02 DIAGNOSIS — I69398 Other sequelae of cerebral infarction: Secondary | ICD-10-CM | POA: Diagnosis not present

## 2023-06-02 DIAGNOSIS — R29898 Other symptoms and signs involving the musculoskeletal system: Secondary | ICD-10-CM | POA: Diagnosis not present

## 2023-06-02 DIAGNOSIS — S80821D Blister (nonthermal), right lower leg, subsequent encounter: Secondary | ICD-10-CM | POA: Diagnosis not present

## 2023-06-02 DIAGNOSIS — R296 Repeated falls: Secondary | ICD-10-CM | POA: Diagnosis not present

## 2023-06-02 DIAGNOSIS — E785 Hyperlipidemia, unspecified: Secondary | ICD-10-CM | POA: Diagnosis not present

## 2023-06-02 DIAGNOSIS — Z7982 Long term (current) use of aspirin: Secondary | ICD-10-CM | POA: Diagnosis not present

## 2023-06-02 DIAGNOSIS — I10 Essential (primary) hypertension: Secondary | ICD-10-CM | POA: Diagnosis not present

## 2023-06-02 DIAGNOSIS — Z8669 Personal history of other diseases of the nervous system and sense organs: Secondary | ICD-10-CM | POA: Diagnosis not present

## 2023-06-02 DIAGNOSIS — E042 Nontoxic multinodular goiter: Secondary | ICD-10-CM | POA: Diagnosis not present

## 2023-06-04 DIAGNOSIS — R29898 Other symptoms and signs involving the musculoskeletal system: Secondary | ICD-10-CM | POA: Diagnosis not present

## 2023-06-04 DIAGNOSIS — S80821D Blister (nonthermal), right lower leg, subsequent encounter: Secondary | ICD-10-CM | POA: Diagnosis not present

## 2023-06-04 DIAGNOSIS — Z7982 Long term (current) use of aspirin: Secondary | ICD-10-CM | POA: Diagnosis not present

## 2023-06-04 DIAGNOSIS — E785 Hyperlipidemia, unspecified: Secondary | ICD-10-CM | POA: Diagnosis not present

## 2023-06-04 DIAGNOSIS — E042 Nontoxic multinodular goiter: Secondary | ICD-10-CM | POA: Diagnosis not present

## 2023-06-04 DIAGNOSIS — I69398 Other sequelae of cerebral infarction: Secondary | ICD-10-CM | POA: Diagnosis not present

## 2023-06-04 DIAGNOSIS — R296 Repeated falls: Secondary | ICD-10-CM | POA: Diagnosis not present

## 2023-06-04 DIAGNOSIS — Z8669 Personal history of other diseases of the nervous system and sense organs: Secondary | ICD-10-CM | POA: Diagnosis not present

## 2023-06-04 DIAGNOSIS — I10 Essential (primary) hypertension: Secondary | ICD-10-CM | POA: Diagnosis not present

## 2023-06-09 DIAGNOSIS — Z7982 Long term (current) use of aspirin: Secondary | ICD-10-CM | POA: Diagnosis not present

## 2023-06-09 DIAGNOSIS — S80821D Blister (nonthermal), right lower leg, subsequent encounter: Secondary | ICD-10-CM | POA: Diagnosis not present

## 2023-06-09 DIAGNOSIS — I10 Essential (primary) hypertension: Secondary | ICD-10-CM | POA: Diagnosis not present

## 2023-06-09 DIAGNOSIS — R29898 Other symptoms and signs involving the musculoskeletal system: Secondary | ICD-10-CM | POA: Diagnosis not present

## 2023-06-09 DIAGNOSIS — I69398 Other sequelae of cerebral infarction: Secondary | ICD-10-CM | POA: Diagnosis not present

## 2023-06-09 DIAGNOSIS — Z8669 Personal history of other diseases of the nervous system and sense organs: Secondary | ICD-10-CM | POA: Diagnosis not present

## 2023-06-09 DIAGNOSIS — E785 Hyperlipidemia, unspecified: Secondary | ICD-10-CM | POA: Diagnosis not present

## 2023-06-09 DIAGNOSIS — R296 Repeated falls: Secondary | ICD-10-CM | POA: Diagnosis not present

## 2023-06-09 DIAGNOSIS — E042 Nontoxic multinodular goiter: Secondary | ICD-10-CM | POA: Diagnosis not present

## 2023-06-11 ENCOUNTER — Ambulatory Visit
Admission: RE | Admit: 2023-06-11 | Discharge: 2023-06-11 | Disposition: A | Discharge status: Home / Self Care | Payer: Medicare PPO | Source: Ambulatory Visit | Attending: Family Medicine | Admitting: Family Medicine

## 2023-06-11 DIAGNOSIS — E049 Nontoxic goiter, unspecified: Secondary | ICD-10-CM

## 2023-06-11 DIAGNOSIS — R29898 Other symptoms and signs involving the musculoskeletal system: Secondary | ICD-10-CM | POA: Diagnosis not present

## 2023-06-11 DIAGNOSIS — E785 Hyperlipidemia, unspecified: Secondary | ICD-10-CM | POA: Diagnosis not present

## 2023-06-11 DIAGNOSIS — S80821D Blister (nonthermal), right lower leg, subsequent encounter: Secondary | ICD-10-CM | POA: Diagnosis not present

## 2023-06-11 DIAGNOSIS — I10 Essential (primary) hypertension: Secondary | ICD-10-CM | POA: Diagnosis not present

## 2023-06-11 DIAGNOSIS — I69398 Other sequelae of cerebral infarction: Secondary | ICD-10-CM | POA: Diagnosis not present

## 2023-06-11 DIAGNOSIS — Z7982 Long term (current) use of aspirin: Secondary | ICD-10-CM | POA: Diagnosis not present

## 2023-06-11 DIAGNOSIS — E042 Nontoxic multinodular goiter: Secondary | ICD-10-CM | POA: Diagnosis not present

## 2023-06-11 DIAGNOSIS — Z8669 Personal history of other diseases of the nervous system and sense organs: Secondary | ICD-10-CM | POA: Diagnosis not present

## 2023-06-11 DIAGNOSIS — R296 Repeated falls: Secondary | ICD-10-CM | POA: Diagnosis not present

## 2023-06-16 DIAGNOSIS — Z7982 Long term (current) use of aspirin: Secondary | ICD-10-CM | POA: Diagnosis not present

## 2023-06-16 DIAGNOSIS — S80821D Blister (nonthermal), right lower leg, subsequent encounter: Secondary | ICD-10-CM | POA: Diagnosis not present

## 2023-06-16 DIAGNOSIS — I10 Essential (primary) hypertension: Secondary | ICD-10-CM | POA: Diagnosis not present

## 2023-06-16 DIAGNOSIS — Z8669 Personal history of other diseases of the nervous system and sense organs: Secondary | ICD-10-CM | POA: Diagnosis not present

## 2023-06-16 DIAGNOSIS — E785 Hyperlipidemia, unspecified: Secondary | ICD-10-CM | POA: Diagnosis not present

## 2023-06-16 DIAGNOSIS — I69398 Other sequelae of cerebral infarction: Secondary | ICD-10-CM | POA: Diagnosis not present

## 2023-06-16 DIAGNOSIS — E042 Nontoxic multinodular goiter: Secondary | ICD-10-CM | POA: Diagnosis not present

## 2023-06-16 DIAGNOSIS — R296 Repeated falls: Secondary | ICD-10-CM | POA: Diagnosis not present

## 2023-06-16 DIAGNOSIS — R29898 Other symptoms and signs involving the musculoskeletal system: Secondary | ICD-10-CM | POA: Diagnosis not present

## 2023-06-18 DIAGNOSIS — S80821D Blister (nonthermal), right lower leg, subsequent encounter: Secondary | ICD-10-CM | POA: Diagnosis not present

## 2023-06-18 DIAGNOSIS — Z8669 Personal history of other diseases of the nervous system and sense organs: Secondary | ICD-10-CM | POA: Diagnosis not present

## 2023-06-18 DIAGNOSIS — Z7982 Long term (current) use of aspirin: Secondary | ICD-10-CM | POA: Diagnosis not present

## 2023-06-18 DIAGNOSIS — E785 Hyperlipidemia, unspecified: Secondary | ICD-10-CM | POA: Diagnosis not present

## 2023-06-18 DIAGNOSIS — I10 Essential (primary) hypertension: Secondary | ICD-10-CM | POA: Diagnosis not present

## 2023-06-18 DIAGNOSIS — R29898 Other symptoms and signs involving the musculoskeletal system: Secondary | ICD-10-CM | POA: Diagnosis not present

## 2023-06-18 DIAGNOSIS — I69398 Other sequelae of cerebral infarction: Secondary | ICD-10-CM | POA: Diagnosis not present

## 2023-06-18 DIAGNOSIS — R296 Repeated falls: Secondary | ICD-10-CM | POA: Diagnosis not present

## 2023-06-18 DIAGNOSIS — E042 Nontoxic multinodular goiter: Secondary | ICD-10-CM | POA: Diagnosis not present

## 2023-06-23 DIAGNOSIS — R296 Repeated falls: Secondary | ICD-10-CM | POA: Diagnosis not present

## 2023-06-23 DIAGNOSIS — R29898 Other symptoms and signs involving the musculoskeletal system: Secondary | ICD-10-CM | POA: Diagnosis not present

## 2023-06-23 DIAGNOSIS — I69398 Other sequelae of cerebral infarction: Secondary | ICD-10-CM | POA: Diagnosis not present

## 2023-06-23 DIAGNOSIS — E042 Nontoxic multinodular goiter: Secondary | ICD-10-CM | POA: Diagnosis not present

## 2023-06-23 DIAGNOSIS — E785 Hyperlipidemia, unspecified: Secondary | ICD-10-CM | POA: Diagnosis not present

## 2023-06-23 DIAGNOSIS — S80821D Blister (nonthermal), right lower leg, subsequent encounter: Secondary | ICD-10-CM | POA: Diagnosis not present

## 2023-06-23 DIAGNOSIS — Z7982 Long term (current) use of aspirin: Secondary | ICD-10-CM | POA: Diagnosis not present

## 2023-06-23 DIAGNOSIS — Z8669 Personal history of other diseases of the nervous system and sense organs: Secondary | ICD-10-CM | POA: Diagnosis not present

## 2023-06-23 DIAGNOSIS — I10 Essential (primary) hypertension: Secondary | ICD-10-CM | POA: Diagnosis not present

## 2023-06-30 DIAGNOSIS — R29898 Other symptoms and signs involving the musculoskeletal system: Secondary | ICD-10-CM | POA: Diagnosis not present

## 2023-06-30 DIAGNOSIS — I69398 Other sequelae of cerebral infarction: Secondary | ICD-10-CM | POA: Diagnosis not present

## 2023-06-30 DIAGNOSIS — Z8669 Personal history of other diseases of the nervous system and sense organs: Secondary | ICD-10-CM | POA: Diagnosis not present

## 2023-06-30 DIAGNOSIS — Z7982 Long term (current) use of aspirin: Secondary | ICD-10-CM | POA: Diagnosis not present

## 2023-06-30 DIAGNOSIS — S80821D Blister (nonthermal), right lower leg, subsequent encounter: Secondary | ICD-10-CM | POA: Diagnosis not present

## 2023-06-30 DIAGNOSIS — I10 Essential (primary) hypertension: Secondary | ICD-10-CM | POA: Diagnosis not present

## 2023-06-30 DIAGNOSIS — E785 Hyperlipidemia, unspecified: Secondary | ICD-10-CM | POA: Diagnosis not present

## 2023-06-30 DIAGNOSIS — E042 Nontoxic multinodular goiter: Secondary | ICD-10-CM | POA: Diagnosis not present

## 2023-06-30 DIAGNOSIS — R296 Repeated falls: Secondary | ICD-10-CM | POA: Diagnosis not present

## 2023-07-06 DIAGNOSIS — I10 Essential (primary) hypertension: Secondary | ICD-10-CM | POA: Diagnosis not present

## 2023-07-06 DIAGNOSIS — R296 Repeated falls: Secondary | ICD-10-CM | POA: Diagnosis not present

## 2023-07-06 DIAGNOSIS — Z8669 Personal history of other diseases of the nervous system and sense organs: Secondary | ICD-10-CM | POA: Diagnosis not present

## 2023-07-06 DIAGNOSIS — I69398 Other sequelae of cerebral infarction: Secondary | ICD-10-CM | POA: Diagnosis not present

## 2023-07-06 DIAGNOSIS — E042 Nontoxic multinodular goiter: Secondary | ICD-10-CM | POA: Diagnosis not present

## 2023-07-06 DIAGNOSIS — S80821D Blister (nonthermal), right lower leg, subsequent encounter: Secondary | ICD-10-CM | POA: Diagnosis not present

## 2023-07-06 DIAGNOSIS — Z7982 Long term (current) use of aspirin: Secondary | ICD-10-CM | POA: Diagnosis not present

## 2023-07-06 DIAGNOSIS — E785 Hyperlipidemia, unspecified: Secondary | ICD-10-CM | POA: Diagnosis not present

## 2023-07-06 DIAGNOSIS — R29898 Other symptoms and signs involving the musculoskeletal system: Secondary | ICD-10-CM | POA: Diagnosis not present

## 2023-07-14 DIAGNOSIS — I69398 Other sequelae of cerebral infarction: Secondary | ICD-10-CM | POA: Diagnosis not present

## 2023-07-14 DIAGNOSIS — Z7982 Long term (current) use of aspirin: Secondary | ICD-10-CM | POA: Diagnosis not present

## 2023-07-14 DIAGNOSIS — I10 Essential (primary) hypertension: Secondary | ICD-10-CM | POA: Diagnosis not present

## 2023-07-14 DIAGNOSIS — E785 Hyperlipidemia, unspecified: Secondary | ICD-10-CM | POA: Diagnosis not present

## 2023-07-14 DIAGNOSIS — R29898 Other symptoms and signs involving the musculoskeletal system: Secondary | ICD-10-CM | POA: Diagnosis not present

## 2023-07-14 DIAGNOSIS — E042 Nontoxic multinodular goiter: Secondary | ICD-10-CM | POA: Diagnosis not present

## 2023-07-14 DIAGNOSIS — R296 Repeated falls: Secondary | ICD-10-CM | POA: Diagnosis not present

## 2023-07-14 DIAGNOSIS — Z8669 Personal history of other diseases of the nervous system and sense organs: Secondary | ICD-10-CM | POA: Diagnosis not present

## 2023-07-14 DIAGNOSIS — S80821D Blister (nonthermal), right lower leg, subsequent encounter: Secondary | ICD-10-CM | POA: Diagnosis not present

## 2023-07-14 DIAGNOSIS — E0590 Thyrotoxicosis, unspecified without thyrotoxic crisis or storm: Secondary | ICD-10-CM | POA: Diagnosis not present

## 2023-07-28 DIAGNOSIS — E782 Mixed hyperlipidemia: Secondary | ICD-10-CM | POA: Diagnosis not present

## 2023-07-28 DIAGNOSIS — E039 Hypothyroidism, unspecified: Secondary | ICD-10-CM | POA: Diagnosis not present

## 2023-07-28 DIAGNOSIS — M13152 Monoarthritis, not elsewhere classified, left hip: Secondary | ICD-10-CM | POA: Diagnosis not present

## 2023-07-28 DIAGNOSIS — I10 Essential (primary) hypertension: Secondary | ICD-10-CM | POA: Diagnosis not present

## 2023-07-28 DIAGNOSIS — E785 Hyperlipidemia, unspecified: Secondary | ICD-10-CM | POA: Diagnosis not present

## 2023-07-30 ENCOUNTER — Other Ambulatory Visit: Payer: Self-pay | Admitting: Family Medicine

## 2023-07-30 ENCOUNTER — Ambulatory Visit
Admission: RE | Admit: 2023-07-30 | Discharge: 2023-07-30 | Disposition: A | Discharge status: Home / Self Care | Source: Ambulatory Visit | Attending: Family Medicine | Admitting: Family Medicine

## 2023-07-30 DIAGNOSIS — M25552 Pain in left hip: Secondary | ICD-10-CM

## 2023-07-30 DIAGNOSIS — F4381 Prolonged grief disorder: Secondary | ICD-10-CM | POA: Diagnosis not present

## 2023-08-03 DIAGNOSIS — M130 Polyarthritis, unspecified: Secondary | ICD-10-CM | POA: Diagnosis not present

## 2023-08-03 DIAGNOSIS — F4381 Prolonged grief disorder: Secondary | ICD-10-CM | POA: Diagnosis not present

## 2023-08-04 DIAGNOSIS — M25552 Pain in left hip: Secondary | ICD-10-CM | POA: Diagnosis not present

## 2023-08-04 DIAGNOSIS — R531 Weakness: Secondary | ICD-10-CM | POA: Diagnosis not present

## 2023-08-04 DIAGNOSIS — M13152 Monoarthritis, not elsewhere classified, left hip: Secondary | ICD-10-CM | POA: Diagnosis not present

## 2023-08-05 DIAGNOSIS — M25552 Pain in left hip: Secondary | ICD-10-CM | POA: Diagnosis not present

## 2023-08-05 DIAGNOSIS — M13152 Monoarthritis, not elsewhere classified, left hip: Secondary | ICD-10-CM | POA: Diagnosis not present

## 2023-08-05 DIAGNOSIS — R531 Weakness: Secondary | ICD-10-CM | POA: Diagnosis not present

## 2023-08-11 DIAGNOSIS — M25552 Pain in left hip: Secondary | ICD-10-CM | POA: Diagnosis not present

## 2023-08-11 DIAGNOSIS — R531 Weakness: Secondary | ICD-10-CM | POA: Diagnosis not present

## 2023-08-11 DIAGNOSIS — M13152 Monoarthritis, not elsewhere classified, left hip: Secondary | ICD-10-CM | POA: Diagnosis not present

## 2023-08-13 DIAGNOSIS — R531 Weakness: Secondary | ICD-10-CM | POA: Diagnosis not present

## 2023-08-13 DIAGNOSIS — M25552 Pain in left hip: Secondary | ICD-10-CM | POA: Diagnosis not present

## 2023-08-13 DIAGNOSIS — M13152 Monoarthritis, not elsewhere classified, left hip: Secondary | ICD-10-CM | POA: Diagnosis not present

## 2023-08-18 DIAGNOSIS — M13152 Monoarthritis, not elsewhere classified, left hip: Secondary | ICD-10-CM | POA: Diagnosis not present

## 2023-08-18 DIAGNOSIS — M25552 Pain in left hip: Secondary | ICD-10-CM | POA: Diagnosis not present

## 2023-08-18 DIAGNOSIS — R531 Weakness: Secondary | ICD-10-CM | POA: Diagnosis not present

## 2023-08-20 DIAGNOSIS — M13152 Monoarthritis, not elsewhere classified, left hip: Secondary | ICD-10-CM | POA: Diagnosis not present

## 2023-08-20 DIAGNOSIS — M25552 Pain in left hip: Secondary | ICD-10-CM | POA: Diagnosis not present

## 2023-08-20 DIAGNOSIS — R531 Weakness: Secondary | ICD-10-CM | POA: Diagnosis not present

## 2023-08-21 DIAGNOSIS — E782 Mixed hyperlipidemia: Secondary | ICD-10-CM | POA: Diagnosis not present

## 2023-08-21 DIAGNOSIS — I10 Essential (primary) hypertension: Secondary | ICD-10-CM | POA: Diagnosis not present

## 2023-08-21 DIAGNOSIS — F4381 Prolonged grief disorder: Secondary | ICD-10-CM | POA: Diagnosis not present

## 2023-08-21 DIAGNOSIS — R600 Localized edema: Secondary | ICD-10-CM | POA: Diagnosis not present

## 2023-08-25 DIAGNOSIS — R531 Weakness: Secondary | ICD-10-CM | POA: Diagnosis not present

## 2023-08-25 DIAGNOSIS — M25552 Pain in left hip: Secondary | ICD-10-CM | POA: Diagnosis not present

## 2023-08-25 DIAGNOSIS — M13152 Monoarthritis, not elsewhere classified, left hip: Secondary | ICD-10-CM | POA: Diagnosis not present

## 2023-08-27 DIAGNOSIS — M25552 Pain in left hip: Secondary | ICD-10-CM | POA: Diagnosis not present

## 2023-08-27 DIAGNOSIS — M13152 Monoarthritis, not elsewhere classified, left hip: Secondary | ICD-10-CM | POA: Diagnosis not present

## 2023-08-27 DIAGNOSIS — R531 Weakness: Secondary | ICD-10-CM | POA: Diagnosis not present

## 2023-08-31 DIAGNOSIS — M25552 Pain in left hip: Secondary | ICD-10-CM | POA: Diagnosis not present

## 2023-08-31 DIAGNOSIS — M13152 Monoarthritis, not elsewhere classified, left hip: Secondary | ICD-10-CM | POA: Diagnosis not present

## 2023-08-31 DIAGNOSIS — R531 Weakness: Secondary | ICD-10-CM | POA: Diagnosis not present

## 2023-09-07 DIAGNOSIS — M25552 Pain in left hip: Secondary | ICD-10-CM | POA: Diagnosis not present

## 2023-09-07 DIAGNOSIS — R531 Weakness: Secondary | ICD-10-CM | POA: Diagnosis not present

## 2023-09-07 DIAGNOSIS — M13152 Monoarthritis, not elsewhere classified, left hip: Secondary | ICD-10-CM | POA: Diagnosis not present

## 2023-09-08 DIAGNOSIS — I10 Essential (primary) hypertension: Secondary | ICD-10-CM | POA: Diagnosis not present

## 2023-09-08 DIAGNOSIS — M130 Polyarthritis, unspecified: Secondary | ICD-10-CM | POA: Diagnosis not present

## 2023-09-08 DIAGNOSIS — R2689 Other abnormalities of gait and mobility: Secondary | ICD-10-CM | POA: Diagnosis not present

## 2023-09-08 DIAGNOSIS — J399 Disease of upper respiratory tract, unspecified: Secondary | ICD-10-CM | POA: Diagnosis not present

## 2023-09-09 DIAGNOSIS — M25552 Pain in left hip: Secondary | ICD-10-CM | POA: Diagnosis not present

## 2023-09-09 DIAGNOSIS — R531 Weakness: Secondary | ICD-10-CM | POA: Diagnosis not present

## 2023-09-09 DIAGNOSIS — M13152 Monoarthritis, not elsewhere classified, left hip: Secondary | ICD-10-CM | POA: Diagnosis not present

## 2023-09-14 DIAGNOSIS — R531 Weakness: Secondary | ICD-10-CM | POA: Diagnosis not present

## 2023-09-14 DIAGNOSIS — M25552 Pain in left hip: Secondary | ICD-10-CM | POA: Diagnosis not present

## 2023-09-14 DIAGNOSIS — M13152 Monoarthritis, not elsewhere classified, left hip: Secondary | ICD-10-CM | POA: Diagnosis not present

## 2023-09-15 DIAGNOSIS — R2689 Other abnormalities of gait and mobility: Secondary | ICD-10-CM | POA: Diagnosis not present

## 2023-09-15 DIAGNOSIS — R7303 Prediabetes: Secondary | ICD-10-CM | POA: Diagnosis not present

## 2023-09-15 DIAGNOSIS — E039 Hypothyroidism, unspecified: Secondary | ICD-10-CM | POA: Diagnosis not present

## 2023-09-15 DIAGNOSIS — I10 Essential (primary) hypertension: Secondary | ICD-10-CM | POA: Diagnosis not present

## 2023-09-16 DIAGNOSIS — R531 Weakness: Secondary | ICD-10-CM | POA: Diagnosis not present

## 2023-09-16 DIAGNOSIS — M25552 Pain in left hip: Secondary | ICD-10-CM | POA: Diagnosis not present

## 2023-09-16 DIAGNOSIS — M13152 Monoarthritis, not elsewhere classified, left hip: Secondary | ICD-10-CM | POA: Diagnosis not present

## 2023-09-22 DIAGNOSIS — M130 Polyarthritis, unspecified: Secondary | ICD-10-CM | POA: Diagnosis not present

## 2023-09-22 DIAGNOSIS — R739 Hyperglycemia, unspecified: Secondary | ICD-10-CM | POA: Diagnosis not present

## 2023-09-22 DIAGNOSIS — E039 Hypothyroidism, unspecified: Secondary | ICD-10-CM | POA: Diagnosis not present

## 2023-09-23 DIAGNOSIS — R531 Weakness: Secondary | ICD-10-CM | POA: Diagnosis not present

## 2023-09-23 DIAGNOSIS — M25552 Pain in left hip: Secondary | ICD-10-CM | POA: Diagnosis not present

## 2023-09-23 DIAGNOSIS — M13152 Monoarthritis, not elsewhere classified, left hip: Secondary | ICD-10-CM | POA: Diagnosis not present

## 2023-09-24 DIAGNOSIS — M13152 Monoarthritis, not elsewhere classified, left hip: Secondary | ICD-10-CM | POA: Diagnosis not present

## 2023-09-24 DIAGNOSIS — R531 Weakness: Secondary | ICD-10-CM | POA: Diagnosis not present

## 2023-09-24 DIAGNOSIS — M25552 Pain in left hip: Secondary | ICD-10-CM | POA: Diagnosis not present

## 2023-09-25 DIAGNOSIS — S52134A Nondisplaced fracture of neck of right radius, initial encounter for closed fracture: Secondary | ICD-10-CM | POA: Diagnosis not present

## 2023-09-25 DIAGNOSIS — S022XXA Fracture of nasal bones, initial encounter for closed fracture: Secondary | ICD-10-CM | POA: Diagnosis not present

## 2023-09-25 DIAGNOSIS — K118 Other diseases of salivary glands: Secondary | ICD-10-CM | POA: Diagnosis not present

## 2023-09-25 DIAGNOSIS — Z23 Encounter for immunization: Secondary | ICD-10-CM | POA: Diagnosis not present

## 2023-09-25 DIAGNOSIS — S01511A Laceration without foreign body of lip, initial encounter: Secondary | ICD-10-CM | POA: Diagnosis not present

## 2023-09-25 DIAGNOSIS — R220 Localized swelling, mass and lump, head: Secondary | ICD-10-CM | POA: Diagnosis not present

## 2023-09-25 DIAGNOSIS — J320 Chronic maxillary sinusitis: Secondary | ICD-10-CM | POA: Diagnosis not present

## 2023-09-25 DIAGNOSIS — M25421 Effusion, right elbow: Secondary | ICD-10-CM | POA: Diagnosis not present

## 2023-09-25 DIAGNOSIS — S0121XA Laceration without foreign body of nose, initial encounter: Secondary | ICD-10-CM | POA: Diagnosis not present

## 2023-09-25 DIAGNOSIS — R040 Epistaxis: Secondary | ICD-10-CM | POA: Diagnosis not present

## 2023-09-25 DIAGNOSIS — Z043 Encounter for examination and observation following other accident: Secondary | ICD-10-CM | POA: Diagnosis not present

## 2023-09-25 DIAGNOSIS — S52131A Displaced fracture of neck of right radius, initial encounter for closed fracture: Secondary | ICD-10-CM | POA: Diagnosis not present

## 2023-09-25 DIAGNOSIS — M50321 Other cervical disc degeneration at C4-C5 level: Secondary | ICD-10-CM | POA: Diagnosis not present

## 2023-09-28 DIAGNOSIS — S032XXA Dislocation of tooth, initial encounter: Secondary | ICD-10-CM | POA: Diagnosis not present

## 2023-09-28 DIAGNOSIS — I10 Essential (primary) hypertension: Secondary | ICD-10-CM | POA: Diagnosis not present

## 2023-09-28 DIAGNOSIS — S52101A Unspecified fracture of upper end of right radius, initial encounter for closed fracture: Secondary | ICD-10-CM | POA: Diagnosis not present

## 2023-09-28 DIAGNOSIS — W010XXA Fall on same level from slipping, tripping and stumbling without subsequent striking against object, initial encounter: Secondary | ICD-10-CM | POA: Diagnosis not present

## 2023-10-01 DIAGNOSIS — M79601 Pain in right arm: Secondary | ICD-10-CM | POA: Diagnosis not present

## 2023-10-03 DIAGNOSIS — M130 Polyarthritis, unspecified: Secondary | ICD-10-CM | POA: Diagnosis not present

## 2023-10-24 DIAGNOSIS — S065X0A Traumatic subdural hemorrhage without loss of consciousness, initial encounter: Secondary | ICD-10-CM | POA: Diagnosis not present

## 2023-10-24 DIAGNOSIS — I6523 Occlusion and stenosis of bilateral carotid arteries: Secondary | ICD-10-CM | POA: Diagnosis not present

## 2023-10-24 DIAGNOSIS — J320 Chronic maxillary sinusitis: Secondary | ICD-10-CM | POA: Diagnosis not present

## 2023-10-24 DIAGNOSIS — Z043 Encounter for examination and observation following other accident: Secondary | ICD-10-CM | POA: Diagnosis not present

## 2023-10-24 DIAGNOSIS — S0033XA Contusion of nose, initial encounter: Secondary | ICD-10-CM | POA: Diagnosis not present

## 2023-10-24 DIAGNOSIS — S065XAA Traumatic subdural hemorrhage with loss of consciousness status unknown, initial encounter: Secondary | ICD-10-CM | POA: Diagnosis not present

## 2023-11-03 DIAGNOSIS — I10 Essential (primary) hypertension: Secondary | ICD-10-CM | POA: Diagnosis not present

## 2023-11-03 DIAGNOSIS — R413 Other amnesia: Secondary | ICD-10-CM | POA: Diagnosis not present

## 2023-11-05 DIAGNOSIS — M79601 Pain in right arm: Secondary | ICD-10-CM | POA: Diagnosis not present

## 2023-12-22 DIAGNOSIS — R739 Hyperglycemia, unspecified: Secondary | ICD-10-CM | POA: Diagnosis not present

## 2023-12-22 DIAGNOSIS — I10 Essential (primary) hypertension: Secondary | ICD-10-CM | POA: Diagnosis not present

## 2023-12-22 DIAGNOSIS — R7303 Prediabetes: Secondary | ICD-10-CM | POA: Diagnosis not present

## 2023-12-22 DIAGNOSIS — R296 Repeated falls: Secondary | ICD-10-CM | POA: Diagnosis not present

## 2023-12-22 DIAGNOSIS — M130 Polyarthritis, unspecified: Secondary | ICD-10-CM | POA: Diagnosis not present

## 2023-12-22 DIAGNOSIS — E038 Other specified hypothyroidism: Secondary | ICD-10-CM | POA: Diagnosis not present

## 2023-12-22 DIAGNOSIS — E785 Hyperlipidemia, unspecified: Secondary | ICD-10-CM | POA: Diagnosis not present

## 2023-12-22 DIAGNOSIS — Z9989 Dependence on other enabling machines and devices: Secondary | ICD-10-CM | POA: Diagnosis not present

## 2024-02-22 DIAGNOSIS — I10 Essential (primary) hypertension: Secondary | ICD-10-CM | POA: Diagnosis not present

## 2024-02-22 DIAGNOSIS — Z9989 Dependence on other enabling machines and devices: Secondary | ICD-10-CM | POA: Diagnosis not present

## 2024-02-22 DIAGNOSIS — E785 Hyperlipidemia, unspecified: Secondary | ICD-10-CM | POA: Diagnosis not present

## 2024-02-22 DIAGNOSIS — M130 Polyarthritis, unspecified: Secondary | ICD-10-CM | POA: Diagnosis not present

## 2024-02-22 DIAGNOSIS — E039 Hypothyroidism, unspecified: Secondary | ICD-10-CM | POA: Diagnosis not present

## 2024-02-22 DIAGNOSIS — R634 Abnormal weight loss: Secondary | ICD-10-CM | POA: Diagnosis not present

## 2024-02-22 DIAGNOSIS — M6281 Muscle weakness (generalized): Secondary | ICD-10-CM | POA: Diagnosis not present

## 2024-02-22 DIAGNOSIS — Z0000 Encounter for general adult medical examination without abnormal findings: Secondary | ICD-10-CM | POA: Diagnosis not present

## 2024-03-28 DIAGNOSIS — E039 Hypothyroidism, unspecified: Secondary | ICD-10-CM | POA: Diagnosis not present

## 2024-03-28 DIAGNOSIS — I10 Essential (primary) hypertension: Secondary | ICD-10-CM | POA: Diagnosis not present

## 2024-03-28 DIAGNOSIS — E7800 Pure hypercholesterolemia, unspecified: Secondary | ICD-10-CM | POA: Diagnosis not present

## 2024-03-28 DIAGNOSIS — R739 Hyperglycemia, unspecified: Secondary | ICD-10-CM | POA: Diagnosis not present

## 2024-03-28 DIAGNOSIS — T7840XS Allergy, unspecified, sequela: Secondary | ICD-10-CM | POA: Diagnosis not present

## 2024-05-09 DIAGNOSIS — E039 Hypothyroidism, unspecified: Secondary | ICD-10-CM | POA: Diagnosis not present

## 2024-05-09 DIAGNOSIS — I10 Essential (primary) hypertension: Secondary | ICD-10-CM | POA: Diagnosis not present

## 2024-05-09 DIAGNOSIS — R634 Abnormal weight loss: Secondary | ICD-10-CM | POA: Diagnosis not present

## 2024-05-09 DIAGNOSIS — R296 Repeated falls: Secondary | ICD-10-CM | POA: Diagnosis not present
